# Patient Record
Sex: Male | Born: 1950
Health system: Southern US, Community
[De-identification: ages and names within clinical notes are randomized; demographics above are authoritative.]

## PROBLEM LIST (undated history)

## (undated) DIAGNOSIS — I10 Essential (primary) hypertension: Secondary | ICD-10-CM

## (undated) DIAGNOSIS — N189 Chronic kidney disease, unspecified: Secondary | ICD-10-CM

## (undated) HISTORY — PX: COLONOSCOPY: SHX174

## (undated) HISTORY — PX: ANKLE SURGERY: SHX546

## (undated) HISTORY — PX: EYE SURGERY: SHX253

---

## 2001-09-30 ENCOUNTER — Encounter: Payer: Self-pay | Admitting: Family Medicine

## 2001-09-30 ENCOUNTER — Ambulatory Visit (HOSPITAL_COMMUNITY): Admission: RE | Admit: 2001-09-30 | Discharge: 2001-09-30 | Payer: Self-pay | Admitting: Family Medicine

## 2002-02-13 ENCOUNTER — Ambulatory Visit (HOSPITAL_COMMUNITY): Admission: RE | Admit: 2002-02-13 | Discharge: 2002-02-13 | Payer: Self-pay | Admitting: Family Medicine

## 2002-02-13 ENCOUNTER — Encounter: Payer: Self-pay | Admitting: Family Medicine

## 2002-03-12 ENCOUNTER — Ambulatory Visit (HOSPITAL_COMMUNITY): Admission: RE | Admit: 2002-03-12 | Discharge: 2002-03-12 | Payer: Self-pay | Admitting: General Surgery

## 2003-02-12 ENCOUNTER — Encounter: Payer: Self-pay | Admitting: Family Medicine

## 2003-02-12 ENCOUNTER — Ambulatory Visit (HOSPITAL_COMMUNITY): Admission: RE | Admit: 2003-02-12 | Discharge: 2003-02-12 | Payer: Self-pay | Admitting: Family Medicine

## 2007-03-19 ENCOUNTER — Ambulatory Visit (HOSPITAL_COMMUNITY): Admission: RE | Admit: 2007-03-19 | Discharge: 2007-03-19 | Payer: Self-pay | Admitting: Family Medicine

## 2009-12-13 ENCOUNTER — Emergency Department (HOSPITAL_COMMUNITY): Admission: EM | Admit: 2009-12-13 | Discharge: 2009-12-13 | Payer: Self-pay | Admitting: Emergency Medicine

## 2010-10-22 ENCOUNTER — Encounter: Payer: Self-pay | Admitting: Internal Medicine

## 2012-12-26 ENCOUNTER — Other Ambulatory Visit: Payer: Self-pay | Admitting: *Deleted

## 2012-12-26 MED ORDER — METOPROLOL TARTRATE 100 MG PO TABS: ORAL_TABLET | ORAL | Status: DC

## 2012-12-26 MED ORDER — PRAVASTATIN SODIUM 40 MG PO TABS: 40.0000 mg | ORAL_TABLET | Freq: Every day | ORAL | Status: DC

## 2012-12-26 MED ORDER — QUINAPRIL HCL 40 MG PO TABS: ORAL_TABLET | ORAL | Status: DC

## 2012-12-26 NOTE — Telephone Encounter (Signed)
Called patient and verified strength of medicine and directions.

## 2012-12-26 NOTE — Telephone Encounter (Signed)
Please print mail order 

## 2014-01-10 ENCOUNTER — Emergency Department (HOSPITAL_COMMUNITY)
Admission: EM | Admit: 2014-01-10 | Discharge: 2014-01-10 | Disposition: A | Discharge status: Home / Self Care | Payer: 59 | Attending: Emergency Medicine | Admitting: Emergency Medicine

## 2014-01-10 ENCOUNTER — Emergency Department (HOSPITAL_COMMUNITY): Payer: 59

## 2014-01-10 ENCOUNTER — Encounter (HOSPITAL_COMMUNITY): Payer: Self-pay | Admitting: Emergency Medicine

## 2014-01-10 DIAGNOSIS — Z792 Long term (current) use of antibiotics: Secondary | ICD-10-CM | POA: Insufficient documentation

## 2014-01-10 DIAGNOSIS — Y929 Unspecified place or not applicable: Secondary | ICD-10-CM | POA: Insufficient documentation

## 2014-01-10 DIAGNOSIS — Y9389 Activity, other specified: Secondary | ICD-10-CM | POA: Insufficient documentation

## 2014-01-10 DIAGNOSIS — S91311A Laceration without foreign body, right foot, initial encounter: Secondary | ICD-10-CM

## 2014-01-10 DIAGNOSIS — W28XXXA Contact with powered lawn mower, initial encounter: Secondary | ICD-10-CM | POA: Insufficient documentation

## 2014-01-10 DIAGNOSIS — Z23 Encounter for immunization: Secondary | ICD-10-CM | POA: Insufficient documentation

## 2014-01-10 DIAGNOSIS — S99921A Unspecified injury of right foot, initial encounter: Secondary | ICD-10-CM

## 2014-01-10 DIAGNOSIS — I10 Essential (primary) hypertension: Secondary | ICD-10-CM | POA: Insufficient documentation

## 2014-01-10 DIAGNOSIS — S91309A Unspecified open wound, unspecified foot, initial encounter: Secondary | ICD-10-CM | POA: Insufficient documentation

## 2014-01-10 DIAGNOSIS — Z79899 Other long term (current) drug therapy: Secondary | ICD-10-CM | POA: Insufficient documentation

## 2014-01-10 DIAGNOSIS — F17200 Nicotine dependence, unspecified, uncomplicated: Secondary | ICD-10-CM | POA: Insufficient documentation

## 2014-01-10 HISTORY — DX: Essential (primary) hypertension: I10

## 2014-01-10 MED ORDER — CEPHALEXIN 500 MG PO CAPS: 500.0000 mg | ORAL_CAPSULE | Freq: Four times a day (QID) | ORAL | Status: DC

## 2014-01-10 MED ORDER — TETANUS-DIPHTH-ACELL PERTUSSIS 5-2.5-18.5 LF-MCG/0.5 IM SUSP
0.5000 mL | Freq: Once | INTRAMUSCULAR | Status: AC
  Administered 2014-01-10: 0.5 mL via INTRAMUSCULAR
  Filled 2014-01-10: qty 0.5

## 2014-01-10 MED ORDER — OXYCODONE-ACETAMINOPHEN 5-325 MG PO TABS: 1.0000 | ORAL_TABLET | ORAL | Status: DC | PRN

## 2014-01-10 MED ORDER — OXYCODONE-ACETAMINOPHEN 5-325 MG PO TABS
1.0000 | ORAL_TABLET | Freq: Once | ORAL | Status: AC
  Administered 2014-01-10: 1 via ORAL
  Filled 2014-01-10: qty 1

## 2014-01-10 MED ORDER — CEPHALEXIN 500 MG PO CAPS
500.0000 mg | ORAL_CAPSULE | Freq: Once | ORAL | Status: AC
  Administered 2014-01-10: 500 mg via ORAL
  Filled 2014-01-10: qty 1

## 2014-01-10 NOTE — ED Notes (Signed)
Pt presents to ED with cut on posterior aspect of left heel. States he cut it on the lawn mower while mowing the lawn. Pt unable to remember last tetanus shot. No paraesthesia, pulses palpable. No pallor noted.

## 2014-01-10 NOTE — ED Provider Notes (Signed)
CSN: [id number]     Arrival date & time 01/10/14  1008 History  This chart was scribed for non-physician practitioner, Kerrie Buffalo, FNP,working with Benny Lennert, MD, by Karle Plumber, ED Scribe.  This patient was seen in room [facility]12/[facility]12 and the patient's care was started at 10:39 AM.  Chief Complaint  Patient presents with  . Extremity Laceration   The history is provided by the patient. No language interpreter was used.   HPI Comments:  John Proctor is a 63 y.o. male who presents to the Emergency Department complaining of a laceration to the posterior aspect of his left heel that occurred PTA. Pt states he was mowing the lawn and tripped, causing his heel to go under the mower. Pt states his pain is mild at 4/10. He denies any injury to his foot, leg or any other part of his body. He denies any other symptoms and has no other complaints. Bleeding is now controlled.    Past Medical History  Diagnosis Date  . Hypertension    No past surgical history on file. History reviewed. No pertinent family history. History  Substance Use Topics  . Smoking status: Current Every Day Smoker -- 0.50 packs/day for 25 years    Types: Cigarettes  . Smokeless tobacco: Not on file  . Alcohol Use: 1.8 oz/week    3 Cans of beer per week    Review of Systems  A complete 10 system review of systems was obtained and all systems are negative except as noted in the HPI and PMH.   Allergies  Review of patient's allergies indicates no known allergies.  Home Medications   Current Outpatient Rx  Name  Route  Sig  Dispense  Refill  . metoprolol (LOPRESSOR) 100 MG tablet      One by mouth every day   90 tablet   1   . quinapril (ACCUPRIL) 40 MG tablet      One by mouth every day   90 tablet   1   . cephALEXin (KEFLEX) 500 MG capsule   Oral   Take 1 capsule (500 mg total) by mouth 4 (four) times daily.   20 capsule   0   . oxyCODONE-acetaminophen (ROXICET) 5-325 MG per tablet    Oral   Take 1 tablet by mouth every 4 (four) hours as needed for severe pain.   20 tablet   0    Triage Vitals: BP 173/104  Pulse 71  Resp 14  SpO2 96% Physical Exam  Nursing note and vitals reviewed. Constitutional: He is oriented to person, place, and time. He appears well-developed and well-nourished.  Eyes: EOM are normal.  Neck: Neck supple.  Cardiovascular:  Strong pedal pulses.   Pulmonary/Chest: Effort normal.  Abdominal: Soft. There is no tenderness.  Musculoskeletal: Normal range of motion.  Neurological: He is alert and oriented to person, place, and time. No cranial nerve deficit.  Skin: Skin is warm and dry.  6 cm avulsion laceration to posterior heel. Circular laceration above avulsion that is approximately 6 cm.   Psychiatric: He has a normal mood and affect. His behavior is normal.    ED Course  Procedures (including critical care time) Wound care, soaked in NSS with Betadine, wet to dry dressing, Antibiotics, pain management, Tetanus update.  Consult with ortho.   DIAGNOSTIC STUDIES: Oxygen Saturation is 96% on RA, normal by my interpretation.   COORDINATION OF CARE: 10:42 AM- Will X-Ray left foot to check for bony fragment.  Pt verbalizes understanding and agrees to plan. Labs Review Labs Reviewed - No data to display Imaging Review Dg Foot Complete Left  01/10/2014   CLINICAL DATA:  Laceration left heel from lawn mower injury.  EXAM: LEFT FOOT - COMPLETE 3+ VIEW  COMPARISON:  None.  FINDINGS: There is a focal defect/ laceration in the soft tissues of the heel posteriorly. No radiopaque foreign body. Joints of the left foot are aligned. No acute or healing fracture is identified.  IMPRESSION: Laceration of the posterior soft tissues of the heel. No acute osseous abnormality.   Electronically Signed   By: Britta MccreedySusan  Turner M.D.   On: 01/10/2014 11:09     MDM  I spoke with Dr. Romeo AppleHarrison, ortho on call, and he request that the area be cleaned well and wet to dry  dressing and follow up in the office.    I have reviewed this patient's vital signs, nurses notes, appropriate labs and imaging. I have discussed findings with the patient and plan of care. He states that he is a friend of Dr. Hilda LiasKeeling and he will follow up with him tomorrow. He will return here sooner for any problems.    Medication List    TAKE these medications       cephALEXin 500 MG capsule  Commonly known as:  KEFLEX  Take 1 capsule (500 mg total) by mouth 4 (four) times daily.     oxyCODONE-acetaminophen 5-325 MG per tablet  Commonly known as:  ROXICET  Take 1 tablet by mouth every 4 (four) hours as needed for severe pain.      ASK your doctor about these medications       metoprolol 100 MG tablet  Commonly known as:  LOPRESSOR  One by mouth every day     quinapril 40 MG tablet  Commonly known as:  ACCUPRIL  One by mouth every day         I personally performed the services described in this documentation, which was scribed in my presence. The recorded information has been reviewed and is accurate.    [facility] Orlene OchM Emberlin Verner, NP 01/10/14 1311

## 2014-01-10 NOTE — ED Notes (Signed)
Pt does not want crutches, states he has crutches at home that he can use

## 2014-01-10 NOTE — Discharge Instructions (Signed)
You have a large wound to the heel of your right foot that can not be sutured. You will need to follow up with Dr.Keeling tomorrow to assess the injury and decide if you will need a skin graft. Be sure to take the antibiotics to prevent infection. Do not take the narcotic if you are driving as it will make you sleepy. You can take Advil in addition to the other medications. Elevate the foot. Return as needed for problems.

## 2014-01-10 NOTE — ED Provider Notes (Signed)
Medical screening examination/treatment/procedure(s) were conducted as a shared visit with non-physician practitioner(s) and myself.  I personally evaluated the patient during the encounter.   EKG Interpretation None      Pt cut right heel on lawnmower.  pe avulsion superficial right heel  John LennertJoseph L Kayon Dozier, MD 01/10/14 1507

## 2014-01-10 NOTE — ED Notes (Signed)
Pt stated that he did need crutches, tall crutches provided for pt

## 2016-05-16 ENCOUNTER — Encounter (HOSPITAL_COMMUNITY): Payer: Self-pay | Admitting: Emergency Medicine

## 2016-05-16 ENCOUNTER — Emergency Department (HOSPITAL_COMMUNITY)
Admission: EM | Admit: 2016-05-16 | Discharge: 2016-05-17 | Disposition: A | Discharge status: Home / Self Care | Payer: 59 | Attending: Emergency Medicine | Admitting: Emergency Medicine

## 2016-05-16 DIAGNOSIS — I10 Essential (primary) hypertension: Secondary | ICD-10-CM | POA: Insufficient documentation

## 2016-05-16 DIAGNOSIS — N289 Disorder of kidney and ureter, unspecified: Secondary | ICD-10-CM | POA: Diagnosis not present

## 2016-05-16 DIAGNOSIS — F17210 Nicotine dependence, cigarettes, uncomplicated: Secondary | ICD-10-CM | POA: Insufficient documentation

## 2016-05-16 DIAGNOSIS — R1031 Right lower quadrant pain: Secondary | ICD-10-CM | POA: Diagnosis present

## 2016-05-16 DIAGNOSIS — Z79899 Other long term (current) drug therapy: Secondary | ICD-10-CM | POA: Diagnosis not present

## 2016-05-16 DIAGNOSIS — N23 Unspecified renal colic: Secondary | ICD-10-CM

## 2016-05-16 LAB — COMPREHENSIVE METABOLIC PANEL
ALBUMIN: 4.1 g/dL (ref 3.5–5.0)
ALT: 18 U/L (ref 17–63)
ANION GAP: 10 (ref 5–15)
AST: 25 U/L (ref 15–41)
Alkaline Phosphatase: 67 U/L (ref 38–126)
BILIRUBIN TOTAL: 0.5 mg/dL (ref 0.3–1.2)
BUN: 19 mg/dL (ref 6–20)
CHLORIDE: 105 mmol/L (ref 101–111)
CO2: 22 mmol/L (ref 22–32)
Calcium: 9.1 mg/dL (ref 8.9–10.3)
Creatinine, Ser: 1.34 mg/dL — ABNORMAL HIGH (ref 0.61–1.24)
GFR calc Af Amer: >60 mL/min (ref >60)
GFR, EST NON AFRICAN AMERICAN: 54 mL/min — AB (ref >60)
Glucose, Bld: 151 mg/dL — ABNORMAL HIGH (ref 65–99)
Potassium: 3.6 mmol/L (ref 3.5–5.1)
Sodium: 137 mmol/L (ref 135–145)
TOTAL PROTEIN: 7.3 g/dL (ref 6.5–8.1)

## 2016-05-16 LAB — LIPASE, BLOOD: Lipase: 50 U/L (ref 11–51)

## 2016-05-16 LAB — CBC
HCT: 43.5 % (ref 39.0–52.0)
HEMOGLOBIN: 14.8 g/dL (ref 13.0–17.0)
MCH: 29 pg (ref 26.0–34.0)
MCHC: 34 g/dL (ref 30.0–36.0)
MCV: 85.1 fL (ref 78.0–100.0)
Platelets: 237 10*3/uL (ref 150–400)
RBC: 5.11 MIL/uL (ref 4.22–5.81)
RDW: 12.8 % (ref 11.5–15.5)
WBC: 7.3 10*3/uL (ref 4.0–10.5)

## 2016-05-16 MED ORDER — SODIUM CHLORIDE 0.9 % IV SOLN: INTRAVENOUS | Status: DC

## 2016-05-16 MED ORDER — SODIUM CHLORIDE 0.9 % IV BOLUS (SEPSIS)
1000.0000 mL | Freq: Once | INTRAVENOUS | Status: AC
  Administered 2016-05-16: 1000 mL via INTRAVENOUS

## 2016-05-16 MED ORDER — KETOROLAC TROMETHAMINE 30 MG/ML IJ SOLN
30.0000 mg | Freq: Once | INTRAMUSCULAR | Status: AC
  Administered 2016-05-16: 30 mg via INTRAVENOUS
  Filled 2016-05-16: qty 1

## 2016-05-16 MED ORDER — ONDANSETRON HCL 4 MG/2ML IJ SOLN
4.0000 mg | Freq: Once | INTRAMUSCULAR | Status: AC
  Administered 2016-05-16: 4 mg via INTRAVENOUS
  Filled 2016-05-16: qty 2

## 2016-05-16 NOTE — ED Triage Notes (Signed)
Pt with RLQ abdominal pain that started about an hour ago.

## 2016-05-17 ENCOUNTER — Emergency Department (HOSPITAL_COMMUNITY): Payer: 59

## 2016-05-17 LAB — URINE MICROSCOPIC-ADD ON: SQUAMOUS EPITHELIAL / LPF: NONE SEEN

## 2016-05-17 LAB — URINALYSIS, ROUTINE W REFLEX MICROSCOPIC
Bilirubin Urine: NEGATIVE
Glucose, UA: NEGATIVE mg/dL
KETONES UR: NEGATIVE mg/dL
LEUKOCYTES UA: NEGATIVE
NITRITE: NEGATIVE
Specific Gravity, Urine: 1.02 (ref 1.005–1.030)
pH: 7 (ref 5.0–8.0)

## 2016-05-17 MED ORDER — TRAMADOL HCL 50 MG PO TABS
100.0000 mg | ORAL_TABLET | Freq: Four times a day (QID) | ORAL | 0 refills | Status: DC | PRN
Start: 2016-05-17 — End: 2019-07-14

## 2016-05-17 MED ORDER — METOCLOPRAMIDE HCL 5 MG/ML IJ SOLN
INTRAMUSCULAR | Status: AC
  Administered 2016-05-17: 10 mg
  Filled 2016-05-17: qty 2

## 2016-05-17 MED ORDER — FENTANYL CITRATE (PF) 100 MCG/2ML IJ SOLN
25.0000 ug | Freq: Once | INTRAMUSCULAR | Status: AC
  Administered 2016-05-17: 25 ug via INTRAVENOUS
  Filled 2016-05-17: qty 2

## 2016-05-17 MED ORDER — DIPHENHYDRAMINE HCL 50 MG/ML IJ SOLN
25.0000 mg | Freq: Once | INTRAMUSCULAR | Status: AC
  Administered 2016-05-17: 25 mg via INTRAVENOUS
  Filled 2016-05-17: qty 1

## 2016-05-17 MED ORDER — ONDANSETRON HCL 4 MG PO TABS: 4.0000 mg | ORAL_TABLET | Freq: Three times a day (TID) | ORAL | 0 refills | Status: DC | PRN

## 2016-05-17 NOTE — ED Notes (Signed)
Pt reports that he is unable to void at this time 

## 2016-05-17 NOTE — Discharge Instructions (Signed)
Drink plenty of fluids. Take the tramadol with acetaminophen 1000 mg 4 times a day for pain. Uses Zofran for nausea or vomiting. Recheck if you get fever, have uncontrolled vomiting or worsening pain. Her CT scan does not show any further kidney stones. However it was suggestive that you had just passed a kidney stone. Her pain should improve now. You need to get back with your primary care physician about your blood pressure and your kidney function.

## 2016-05-17 NOTE — ED Notes (Signed)
Pt reports increasing pain and nausea, and requesting meds. Dr Lynelle DoctorKnapp informed.

## 2016-05-17 NOTE — ED Notes (Signed)
To CT via wheelchair

## 2016-05-17 NOTE — ED Provider Notes (Signed)
[facility]-EMERGENCY DEPT Provider Note   CSN: [id number] Arrival date & time: 05/16/16  2256 By signing my name below, I, Bridgette Habermann, attest that this documentation has been prepared under the direction and in the presence of Devoria Albe, MD. Electronically Signed: Bridgette Habermann, ED Scribe. 05/17/16. 12:25 AM.  Time Seen 12:13 AM  History   Chief Complaint Chief Complaint  Patient presents with  . Abdominal Pain   HPI Comments: John Proctor is a 65 y.o. male with h/o HTN who presents to the Emergency Department complaining of sudden onset, constant, gradually worsening, non-radiating, aching, dull, 5/10 RLQ abdominal pain around 10 pm tonight. Pt states the pain woke him from his sleep. Pt also has associated mild nausea. Pt states pain is exacerbated with ambulating. He notes pain is improved when he bends his right knee. Denies h/o similar symptoms. Denies h/o abdominal surgery. Pt has been off his regular medications for a year and a half. Pt is an occasional drinker, he does not smoke. Pt denies fever, dysuria, urinary frequency, hematuria, vomiting, or any other associated symptoms.    PCP: Dr. Hughie Closs  The history is provided by the patient. No language interpreter was used.    Past Medical History:  Diagnosis Date  . Hypertension    There are no active problems to display for this patient.  History reviewed. No pertinent surgical history.   Home Medications    No meds in 1 1/2 years  Prior to Admission medications   Medication Sig Start Date End Date Taking? Authorizing Provider  cephALEXin (KEFLEX) 500 MG capsule Take 1 capsule (500 mg total) by mouth 4 (four) times daily. 01/10/14   Hope Orlene Och, NP  metoprolol (LOPRESSOR) 100 MG tablet One by mouth every day 12/26/12   Horald Pollen, PA-C  ondansetron (ZOFRAN) 4 MG tablet Take 1 tablet (4 mg total) by mouth every 8 (eight) hours as needed. 05/17/16   Devoria Albe, MD  oxyCODONE-acetaminophen (ROXICET) 5-325 MG per tablet Take 1  tablet by mouth every 4 (four) hours as needed for severe pain. 01/10/14   Hope Orlene Och, NP  quinapril (ACCUPRIL) 40 MG tablet One by mouth every day 12/26/12   Horald Pollen, PA-C  traMADol (ULTRAM) 50 MG tablet Take 2 tablets (100 mg total) by mouth every 6 (six) hours as needed. 05/17/16   Devoria Albe, MD    Family History History reviewed. No pertinent family history.  Social History Social History  Substance Use Topics  . Smoking status: Current Every Day Smoker    Packs/day: 0.50    Years: 25.00    Types: Cigarettes  . Smokeless tobacco: Never Used  . Alcohol use 1.8 oz/week    3 Cans of beer per week  employed   Allergies   Review of patient's allergies indicates no known allergies.   Review of Systems Review of Systems  Constitutional: Negative for fever.  Gastrointestinal: Positive for abdominal pain and nausea. Negative for vomiting.  Genitourinary: Negative for dysuria, frequency and hematuria.  All other systems reviewed and are negative.    Physical Exam Updated Vital Signs BP (!) 179/103 (BP Location: Left Arm)   Pulse 64   Temp 98.9 F (37.2 C) (Tympanic)   Resp 20   Ht 6\' 4"  (1.93 m)   Wt 205 lb (93 kg)   SpO2 100%   BMI 24.95 kg/m   Vital signs normal Except for hypertension   Physical Exam  Constitutional: He is oriented to  person, place, and time. He appears well-developed and well-nourished.  Non-toxic appearance. He does not appear ill. No distress.  HENT:  Head: Normocephalic and atraumatic.  Right Ear: External ear normal.  Left Ear: External ear normal.  Nose: Nose normal. No mucosal edema or rhinorrhea.  Mouth/Throat: Oropharynx is clear and moist and mucous membranes are normal. No dental abscesses or uvula swelling.  Eyes: Conjunctivae and EOM are normal. Pupils are equal, round, and reactive to light.  Neck: Normal range of motion and full passive range of motion without pain. Neck supple.  Cardiovascular: Normal rate, regular rhythm  and normal heart sounds.  Exam reveals no gallop and no friction rub.   No murmur heard. Pulmonary/Chest: Effort normal and breath sounds normal. No respiratory distress. He has no wheezes. He has no rhonchi. He has no rales. He exhibits no tenderness and no crepitus.  Abdominal: Soft. Normal appearance and bowel sounds are normal. He exhibits no distension. There is tenderness. There is no rebound and no guarding.    Tender in RLQ. No pain with knee-capping. No psoas is sign.  Musculoskeletal: Normal range of motion. He exhibits no edema or tenderness.  Moves all extremities well.   Neurological: He is alert and oriented to person, place, and time. He has normal strength. No cranial nerve deficit.  Skin: Skin is warm, dry and intact. No rash noted. No erythema. No pallor.  Psychiatric: He has a normal mood and affect. His speech is normal and behavior is normal. His mood appears not anxious.  Nursing note and vitals reviewed.    ED Treatments / Results  DIAGNOSTIC STUDIES: Oxygen Saturation is 100% on RA, normal by my interpretation.      Labs (all labs ordered are listed, but only abnormal results are displayed) Results for orders placed or performed during the hospital encounter of 05/16/16  Urinalysis, Routine w reflex microscopic (not at [facility])  Result Value Ref Range   Color, Urine YELLOW YELLOW   APPearance CLEAR CLEAR   Specific Gravity, Urine 1.020 1.005 - 1.030   pH 7.0 5.0 - 8.0   Glucose, UA NEGATIVE NEGATIVE mg/dL   Hgb urine dipstick LARGE (A) NEGATIVE   Bilirubin Urine NEGATIVE NEGATIVE   Ketones, ur NEGATIVE NEGATIVE mg/dL   Protein, ur TRACE (A) NEGATIVE mg/dL   Nitrite NEGATIVE NEGATIVE   Leukocytes, UA NEGATIVE NEGATIVE  Lipase, blood  Result Value Ref Range   Lipase 50 11 - 51 U/L  Comprehensive metabolic panel  Result Value Ref Range   Sodium 137 135 - 145 mmol/L   Potassium 3.6 3.5 - 5.1 mmol/L   Chloride 105 101 - 111 mmol/L   CO2 22 22 - 32 mmol/L     Glucose, Bld 151 (H) 65 - 99 mg/dL   BUN 19 6 - 20 mg/dL   Creatinine, Ser 1.61 (H) 0.61 - 1.24 mg/dL   Calcium 9.1 8.9 - 09.6 mg/dL   Total Protein 7.3 6.5 - 8.1 g/dL   Albumin 4.1 3.5 - 5.0 g/dL   AST 25 15 - 41 U/L   ALT 18 17 - 63 U/L   Alkaline Phosphatase 67 38 - 126 U/L   Total Bilirubin 0.5 0.3 - 1.2 mg/dL   GFR calc non Af Amer 54 (L) >60 mL/min   GFR calc Af Amer >60 >60 mL/min   Anion gap 10 5 - 15  CBC  Result Value Ref Range   WBC 7.3 4.0 - 10.5 K/uL   RBC 5.11 4.22 -  5.81 MIL/uL   Hemoglobin 14.8 13.0 - 17.0 g/dL   HCT 95.243.5 84.139.0 - 32.452.0 %   MCV 85.1 78.0 - 100.0 fL   MCH 29.0 26.0 - 34.0 pg   MCHC 34.0 30.0 - 36.0 g/dL   RDW 40.112.8 02.711.5 - 25.315.5 %   Platelets 237 150 - 400 K/uL  Urine microscopic-add on  Result Value Ref Range   Squamous Epithelial / LPF NONE SEEN NONE SEEN   WBC, UA 0-5 0 - 5 WBC/hpf   RBC / HPF TOO NUMEROUS TO COUNT 0 - 5 RBC/hpf   Bacteria, UA RARE (A) NONE SEEN   Laboratory interpretation all normal except For hematuria, renal insufficiency    EKG  EKG Interpretation None       Radiology Ct Renal Stone Study  Result Date: 05/17/2016 CLINICAL DATA:  Acute onset of right lower quadrant abdominal pain and nausea. Initial encounter. EXAM: CT ABDOMEN AND PELVIS WITHOUT CONTRAST TECHNIQUE: Multidetector CT imaging of the abdomen and pelvis was performed following the standard protocol without IV contrast. COMPARISON:  None. FINDINGS: The visualized lung bases are clear. Diffuse coronary artery calcifications are seen. The liver and spleen are unremarkable in appearance. The gallbladder is within normal limits. The pancreas and adrenal glands are unremarkable. Trace right-sided hydronephrosis is noted, with fluid and stranding seen tracking along the course of the right ureter. No distal obstructing stone is seen. This may reflect a recently passed stone. Would correlate with the patient's symptoms. Nonspecific perinephric stranding is noted  bilaterally. No nonobstructing renal stones are identified. No free fluid is identified. The small bowel is unremarkable in appearance. The stomach is within normal limits. No acute vascular abnormalities are seen. Scattered calcification is seen along the abdominal aorta and its branches. The appendix is normal in caliber, without evidence of appendicitis. Diverticulosis is noted along the entirety of the colon, most prominent at the ascending and sigmoid colon, without evidence of diverticulitis. The bladder is mildly distended and grossly unremarkable. The prostate is mildly enlarged, measuring up to 5.0 cm in transverse dimension. No inguinal lymphadenopathy is seen. No acute osseous abnormalities are identified. Mild degenerative change is noted at the lower lumbar spine. IMPRESSION: 1. Trace right-sided hydronephrosis, with fluid and stranding along the course of the right ureter. No distal obstructing stone seen. This may reflect a recently passed stone. Would correlate with the patient's symptoms. 2. Scattered calcification along the abdominal aorta and its branches. 3. Diffuse coronary artery calcifications seen. 4. Diverticulosis along the entirety of the colon, most prominent at the ascending and sigmoid colon, without evidence of diverticulitis. 5. Mildly enlarged prostate noted. Electronically Signed   By: Roanna RaiderJeffery  Chang M.D.   On: 05/17/2016 01:13    Procedures Procedures (including critical care time)  Medications Ordered in ED Medications  sodium chloride 0.9 % bolus 1,000 mL (0 mLs Intravenous Stopped 05/17/16 0047)  ketorolac (TORADOL) 30 MG/ML injection 30 mg (30 mg Intravenous Given 05/16/16 2338)  ondansetron (ZOFRAN) injection 4 mg (4 mg Intravenous Given 05/16/16 2338)  fentaNYL (SUBLIMAZE) injection 25 mcg (25 mcg Intravenous Given 05/17/16 0231)  metoCLOPramide (REGLAN) 5 MG/ML injection (10 mg  Given 05/17/16 0310)  diphenhydrAMINE (BENADRYL) injection 25 mg (25 mg Intravenous Given  05/17/16 0311)     Initial Impression / Assessment and Plan / ED Course  I have reviewed the triage vital signs and the nursing notes.  Pertinent labs & imaging results that were available during my care of the patient were reviewed by  me and considered in my medical decision making (see chart for details).  Clinical Course   12:24 AM Discussed treatment plan with pt at bedside which includes CT scan and pt agreed to plan.  Recheck at 1:30 AM patient was given a CT result. However he states his pain is getting worse. He was given fentanyl for pain.  Recheck at 3 AM patient states he still having pain and nausea. He was given Reglan and Benadryl.  Recheck at 4:10 AM patient states he's feeling better. We again discussed his test results. His blood pressure now is 148/91 without treatment. We discussed that he has already passed his kidney stone and he has no further kidney stones visible on his CT scan. He should be rechecked because of fever, has uncontrolled vomiting or worsening pain. He should also get back in touch of his primary care doctor about his blood pressure and his renal insufficiency.  Review of the [place] data site shows no entries in the past year.  Final Clinical Impressions(s) / ED Diagnoses   Final diagnoses:  RLQ abdominal pain  Ureteral colic  Renal insufficiency    New Prescriptions New Prescriptions   ONDANSETRON (ZOFRAN) 4 MG TABLET    Take 1 tablet (4 mg total) by mouth every 8 (eight) hours as needed.   TRAMADOL (ULTRAM) 50 MG TABLET    Take 2 tablets (100 mg total) by mouth every 6 (six) hours as needed.    Plan discharge  Devoria AlbeIva Colleen Donahoe, MD, FACEP   I personally performed the services described in this documentation, which was scribed in my presence. The recorded information has been reviewed and considered.  Devoria AlbeIva Demtrius Rounds, MD, Concha PyoFACEP     Raynald Rouillard, MD 05/17/16 [phone]

## 2016-05-18 LAB — URINE CULTURE

## 2018-02-04 ENCOUNTER — Encounter: Payer: Self-pay | Admitting: Nurse Practitioner

## 2018-02-19 ENCOUNTER — Encounter (INDEPENDENT_AMBULATORY_CARE_PROVIDER_SITE_OTHER): Payer: Self-pay | Admitting: *Deleted

## 2018-05-01 ENCOUNTER — Ambulatory Visit: Payer: 59 | Admitting: Nurse Practitioner

## 2018-12-03 ENCOUNTER — Emergency Department (HOSPITAL_COMMUNITY)
Admission: EM | Admit: 2018-12-03 | Discharge: 2018-12-03 | Disposition: A | Discharge status: Home / Self Care | Payer: 59 | Attending: Emergency Medicine | Admitting: Emergency Medicine

## 2018-12-03 ENCOUNTER — Other Ambulatory Visit: Payer: Self-pay

## 2018-12-03 ENCOUNTER — Emergency Department (HOSPITAL_COMMUNITY): Payer: 59

## 2018-12-03 ENCOUNTER — Encounter (HOSPITAL_COMMUNITY): Payer: Self-pay | Admitting: Emergency Medicine

## 2018-12-03 DIAGNOSIS — W109XXA Fall (on) (from) unspecified stairs and steps, initial encounter: Secondary | ICD-10-CM | POA: Insufficient documentation

## 2018-12-03 DIAGNOSIS — I10 Essential (primary) hypertension: Secondary | ICD-10-CM | POA: Diagnosis not present

## 2018-12-03 DIAGNOSIS — W108XXA Fall (on) (from) other stairs and steps, initial encounter: Secondary | ICD-10-CM

## 2018-12-03 DIAGNOSIS — S7002XA Contusion of left hip, initial encounter: Secondary | ICD-10-CM | POA: Insufficient documentation

## 2018-12-03 DIAGNOSIS — Z79899 Other long term (current) drug therapy: Secondary | ICD-10-CM | POA: Insufficient documentation

## 2018-12-03 DIAGNOSIS — M25552 Pain in left hip: Secondary | ICD-10-CM | POA: Insufficient documentation

## 2018-12-03 DIAGNOSIS — F17210 Nicotine dependence, cigarettes, uncomplicated: Secondary | ICD-10-CM | POA: Diagnosis not present

## 2018-12-03 DIAGNOSIS — Y939 Activity, unspecified: Secondary | ICD-10-CM | POA: Diagnosis not present

## 2018-12-03 DIAGNOSIS — Y929 Unspecified place or not applicable: Secondary | ICD-10-CM | POA: Diagnosis not present

## 2018-12-03 DIAGNOSIS — Y999 Unspecified external cause status: Secondary | ICD-10-CM | POA: Diagnosis not present

## 2018-12-03 DIAGNOSIS — S79912A Unspecified injury of left hip, initial encounter: Secondary | ICD-10-CM | POA: Diagnosis present

## 2018-12-03 NOTE — ED Triage Notes (Signed)
Per EMS pt states pt fell on concrete yesterday. Pt has swelling to the left hip. Pt was ambulatory from stretcher to the ER bed.

## 2018-12-03 NOTE — Discharge Instructions (Addendum)
Your x-rays did not show any sign of a broken bone.  You have a deep bruise which will take time to heal.  Use crutches or a cane if you are not able to bear weight comfortably.  Apply ice for 30 minutes at a time, 4 times a day.  Take ibuprofen or naproxen as needed for pain.  If you need additional pain relief, you may take acetaminophen in addition.

## 2018-12-03 NOTE — ED Provider Notes (Signed)
[facility] EMERGENCY DEPARTMENT Provider Note   CSN: [id number] Arrival date & time: 12/03/18  0419    History   Chief Complaint Chief Complaint  Patient presents with  . Hip Pain    HPI JEAN SLOMKA is a 68 y.o. male.   The history is provided by the patient.  He has history of hypertension, and comes in because of hip pain following a fall.  He states that he lost his balance going up some steps and landed injuring his left hip.  He has been able to ambulate on it, but it has gotten progressively more painful through the evening and night.  He currently rates pain at 6/10.  He denies other injury.  Past Medical History:  Diagnosis Date  . Hypertension     There are no active problems to display for this patient.   History reviewed. No pertinent surgical history.      Home Medications    Prior to Admission medications   Medication Sig Start Date End Date Taking? Authorizing Provider  cephALEXin (KEFLEX) 500 MG capsule Take 1 capsule (500 mg total) by mouth 4 (four) times daily. 01/10/14   Janne Napoleon, NP  metoprolol (LOPRESSOR) 100 MG tablet One by mouth every day 12/26/12   Horald Pollen, PA-C  ondansetron (ZOFRAN) 4 MG tablet Take 1 tablet (4 mg total) by mouth every 8 (eight) hours as needed. 05/17/16   Devoria Albe, MD  oxyCODONE-acetaminophen (ROXICET) 5-325 MG per tablet Take 1 tablet by mouth every 4 (four) hours as needed for severe pain. 01/10/14   Janne Napoleon, NP  quinapril (ACCUPRIL) 40 MG tablet One by mouth every day 12/26/12   Horald Pollen, PA-C  traMADol (ULTRAM) 50 MG tablet Take 2 tablets (100 mg total) by mouth every 6 (six) hours as needed. 05/17/16   Devoria Albe, MD    Family History No family history on file.  Social History Social History   Tobacco Use  . Smoking status: Current Every Day Smoker    Packs/day: 0.50    Years: 25.00    Pack years: 12.50    Types: Cigarettes  . Smokeless tobacco: Never Used  Substance Use Topics  .  Alcohol use: Yes    Alcohol/week: 3.0 standard drinks    Types: 3 Cans of beer per week  . Drug use: Not on file     Allergies   Patient has no known allergies.   Review of Systems Review of Systems  All other systems reviewed and are negative.    Physical Exam Updated Vital Signs BP (!) 144/96   Pulse 70   Temp 98.1 F (36.7 C) (Oral)   Resp 16   SpO2 96%   Physical Exam Vitals signs and nursing note reviewed.    68 year old male, resting comfortably and in no acute distress. Vital signs are significant for elevated blood pressure. Oxygen saturation is 96%, which is normal. Head is normocephalic and atraumatic. PERRLA, EOMI. Oropharynx is clear. Neck is nontender and supple without adenopathy or JVD. Back is nontender and there is no CVA tenderness. Lungs are clear without rales, wheezes, or rhonchi. Chest is nontender. Heart has regular rate and rhythm without murmur. Abdomen is soft, flat, nontender without masses or hepatosplenomegaly and peristalsis is normoactive. Extremities: No shortening or rotation of the left leg.  There is point tenderness over the lateral aspect of the left hip.  There is pain with range of motion of the left  hip and any direction.  There is no tenderness palpation over the pelvic brim.  Remainder of extremity exam is normal. Skin is warm and dry without rash. Neurologic: Mental status is normal, cranial nerves are intact, there are no motor or sensory deficits.  ED Treatments / Results   Radiology Ct Hip Left Wo Contrast  Result Date: 12/03/2018 CLINICAL DATA:  Fall on the concrete yesterday with swelling at the left hip. EXAM: CT OF THE LEFT HIP WITHOUT CONTRAST TECHNIQUE: Multidetector CT imaging of the left hip was performed according to the standard protocol. Multiplanar CT image reconstructions were also generated. COMPARISON:  None. FINDINGS: Bones/Joint/Cartilage Negative for fracture or dislocation. Hip osteoarthritis with marginal  spurring and superolateral joint narrowing. Ligaments Suboptimally assessed by CT. Muscles and Tendons Left gluteus maximus swelling with high-density component from hemorrhage. Soft tissues Subcutaneous stranding about the posterior left hip compatible with contusion in this setting. There is 3.7 cm rounded high-density area compatible with hematoma. IMPRESSION: 1. Left gluteal soft tissue contusion/hematoma. 2. Negative for fracture. 3. Moderate to advanced hip osteoarthritis. Electronically Signed   By: Marnee Spring M.D.   On: 12/03/2018 06:13   Dg Hip Unilat W Or Wo Pelvis 2-3 Views Left  Result Date: 12/03/2018 CLINICAL DATA:  Fall with left hip swelling. EXAM: DG HIP (WITH OR WITHOUT PELVIS) 2-3V LEFT COMPARISON:  None. FINDINGS: Equivocal fat stranding about the left lateral hip, possible contusion. No underlying fracture or dislocation. Degenerative hip spurring with joint narrowing asymmetric to the left. Atherosclerotic calcification. Posttraumatic deformity of the anterior right ilium. IMPRESSION: 1. No acute osseous finding. 2. Hip osteoarthritis greater on the left. Electronically Signed   By: Marnee Spring M.D.   On: 12/03/2018 05:19    Procedures Procedures  Medications Ordered in ED Medications - No data to display   Initial Impression / Assessment and Plan / ED Course  I have reviewed the triage vital signs and the nursing notes.  Pertinent imaging results that were available during my care of the patient were reviewed by me and considered in my medical decision making (see chart for details).  Fall with injury to left hip.  I am concerned about possible fracture.  He is sent for x-rays which showed no evidence of fracture.  He is sent for CT scan to look for occult fracture, and this is also negative.  CT finding strongly suggestive of deep bruise.  Patient is advised of these findings.  He is advised to use crutches or cane as needed (he states he has both at home).  He is  advised on applying ice and using over-the-counter analgesics as needed for pain.  Follow-up with orthopedics if not improving.  Final Clinical Impressions(s) / ED Diagnoses   Final diagnoses:  Fall down steps, initial encounter  Contusion of left hip, initial encounter    ED Discharge Orders    None       Dione Booze, MD 12/03/18 [phone]

## 2019-07-14 ENCOUNTER — Encounter: Payer: Self-pay | Admitting: Orthopaedic Surgery

## 2019-07-14 ENCOUNTER — Telehealth: Payer: Self-pay | Admitting: Orthopaedic Surgery

## 2019-07-14 ENCOUNTER — Other Ambulatory Visit: Payer: Self-pay

## 2019-07-14 ENCOUNTER — Ambulatory Visit: Payer: 59

## 2019-07-14 ENCOUNTER — Ambulatory Visit: Payer: 59 | Admitting: Orthopaedic Surgery

## 2019-07-14 VITALS — BP 178/91 | HR 76 | Ht 76.0 in | Wt 193.0 lb

## 2019-07-14 DIAGNOSIS — M5441 Lumbago with sciatica, right side: Secondary | ICD-10-CM | POA: Diagnosis not present

## 2019-07-14 DIAGNOSIS — M25551 Pain in right hip: Secondary | ICD-10-CM

## 2019-07-14 DIAGNOSIS — G8929 Other chronic pain: Secondary | ICD-10-CM

## 2019-07-14 MED ORDER — HYDROCODONE-ACETAMINOPHEN 5-325 MG PO TABS: ORAL_TABLET | ORAL | 0 refills | Status: DC

## 2019-07-14 MED ORDER — PREDNISONE 5 MG (21) PO TBPK: ORAL_TABLET | ORAL | 0 refills | Status: DC

## 2019-07-14 NOTE — Telephone Encounter (Signed)
Patient relays that his medications prescribed today should be sent to the pharmacy which was to be updated today:  [facility], [place].

## 2019-07-14 NOTE — Progress Notes (Signed)
Subjective:    Patient ID: John Proctor, male    DOB: 29-May-1951, 68 y.o.   MRN: [id number]  HPI He fell in March and hurt his pelvis and hip.  He had x-rays then plus CT of the left hip.  He had no fracture but has DJD of the left hip.  He has begun to have right hip pain and right lower back and pelvis pain over the last several months that is getting worse.  He has pain running down his right leg past the knee to the lateral right foot.  He is not getting any better.  He works at [employer] and says at the end of his shift he can hardly walk.  He is getting worse.  He has no new trauma.  Nothing seems to help.  He has tried Tylenol, Advil, heat, ice and massage.   Review of Systems  Constitutional: Positive for activity change.  Musculoskeletal: Positive for arthralgias, back pain and gait problem.  All other systems reviewed and are negative.  For Review of Systems, all other systems reviewed and are negative.  The following is a summary of the past history medically, past history surgically, known current medicines, social history and family history.  This information is gathered electronically by the computer from prior information and documentation.  I review this each visit and have found including this information at this point in the chart is beneficial and informative.   Past Medical History:  Diagnosis Date  . Hypertension     History reviewed. No pertinent surgical history.  Current Outpatient Medications on File Prior to Visit  Medication Sig Dispense Refill  . metoprolol (LOPRESSOR) 100 MG tablet One by mouth every day 90 tablet 1  . naproxen sodium (ALEVE) 220 MG tablet Take 220 mg by mouth.    . quinapril (ACCUPRIL) 40 MG tablet One by mouth every day 90 tablet 1  . rosuvastatin (CRESTOR) 20 MG tablet Take 20 mg by mouth daily.     No current facility-administered medications on file prior to visit.     Social History   Socioeconomic History  .  Marital status: Single    Spouse name: Not on file  . Number of children: Not on file  . Years of education: Not on file  . Highest education level: Not on file  Occupational History  . Not on file  Social Needs  . Financial resource strain: Not on file  . Food insecurity    Worry: Not on file    Inability: Not on file  . Transportation needs    Medical: Not on file    Non-medical: Not on file  Tobacco Use  . Smoking status: Current Every Day Smoker    Packs/day: 0.50    Years: 25.00    Pack years: 12.50    Types: Cigarettes  . Smokeless tobacco: Never Used  Substance and Sexual Activity  . Alcohol use: Yes    Alcohol/week: 3.0 standard drinks    Types: 3 Cans of beer per week  . Drug use: Not on file  . Sexual activity: Not on file  Lifestyle  . Physical activity    Days per week: Not on file    Minutes per session: Not on file  . Stress: Not on file  Relationships  . Social Herbalist on phone: Not on file    Gets together: Not on file    Attends religious service: Not on file  Active member of club or organization: Not on file    Attends meetings of clubs or organizations: Not on file    Relationship status: Not on file  . Intimate partner violence    Fear of current or ex partner: Not on file    Emotionally abused: Not on file    Physically abused: Not on file    Forced sexual activity: Not on file  Other Topics Concern  . Not on file  Social History Narrative  . Not on file    Family History  Problem Relation Age of Onset  . Multiple sclerosis Mother   . High blood pressure Father     BP (!) 178/91   Pulse 76   Ht 6\' 4"  (1.93 m)   Wt 193 lb (87.5 kg)   BMI 23.49 kg/m   Body mass index is 23.49 kg/m.      Objective:   Physical Exam Vitals signs reviewed.  Constitutional:      Appearance: He is well-developed.  HENT:     Head: Normocephalic and atraumatic.  Eyes:     Conjunctiva/sclera: Conjunctivae normal.     Pupils:  Pupils are equal, round, and reactive to light.  Neck:     Musculoskeletal: Normal range of motion and neck supple.  Cardiovascular:     Rate and Rhythm: Normal rate and regular rhythm.  Pulmonary:     Effort: Pulmonary effort is normal.  Abdominal:     Palpations: Abdomen is soft.  Musculoskeletal:       Back:  Skin:    General: Skin is warm and dry.  Neurological:     Mental Status: He is alert and oriented to person, place, and time.     Cranial Nerves: No cranial nerve deficit.     Motor: No abnormal muscle tone.     Coordination: Coordination normal.     Deep Tendon Reflexes: Reflexes are normal and symmetric. Reflexes normal.  Psychiatric:        Behavior: Behavior normal.        Thought Content: Thought content normal.        Judgment: Judgment normal.   x-rays were done of the lumbar spine and right hip, reported separately.  I have reviewed the ER records, x-rays, CT from March of this year.        Assessment & Plan:   Encounter Diagnoses  Name Primary?  . Chronic right-sided low back pain with right-sided sciatica Yes  . Pain in right hip    I will begin prednisone dose pack.  I will begin pain medicine.  Return in one week.  He may need MRI of the back/hip.  Call if any problem.  Precautions discussed.   Electronically Signed April, MD 10/13/202012:40 PM

## 2019-07-23 ENCOUNTER — Encounter: Payer: Self-pay | Admitting: Orthopaedic Surgery

## 2019-07-23 ENCOUNTER — Other Ambulatory Visit: Payer: Self-pay

## 2019-07-23 ENCOUNTER — Ambulatory Visit (INDEPENDENT_AMBULATORY_CARE_PROVIDER_SITE_OTHER): Payer: 59 | Admitting: Orthopaedic Surgery

## 2019-07-23 VITALS — BP 168/94 | HR 63 | Ht 76.0 in | Wt 193.0 lb

## 2019-07-23 DIAGNOSIS — M5441 Lumbago with sciatica, right side: Secondary | ICD-10-CM | POA: Diagnosis not present

## 2019-07-23 DIAGNOSIS — G8929 Other chronic pain: Secondary | ICD-10-CM | POA: Diagnosis not present

## 2019-07-23 MED ORDER — OXYCODONE-ACETAMINOPHEN 7.5-325 MG PO TABS: 1.0000 | ORAL_TABLET | Freq: Four times a day (QID) | ORAL | 0 refills | Status: AC | PRN

## 2019-07-23 NOTE — Progress Notes (Signed)
Patient John Proctor, male DOB:1951/03/31, 68 y.o. XBD:[id number]  Chief Complaint  Patient presents with  . Back Pain    HPI  John Proctor is a 68 y.o. male who has lower back pain, right sided sciatica and right hip pain.  He got better for a short while on the prednisone dose pack but pain has returned. He is hurting.  I will get MRI of the lumbar spine and increase pain medicine.  He is working but having a very hard time at it.   Body mass index is 23.49 kg/m.  ROS  Review of Systems  Constitutional: Positive for activity change.  Musculoskeletal: Positive for arthralgias, back pain and gait problem.  All other systems reviewed and are negative.   All other systems reviewed and are negative.  The following is a summary of the past history medically, past history surgically, known current medicines, social history and family history.  This information is gathered electronically by the computer from prior information and documentation.  I review this each visit and have found including this information at this point in the chart is beneficial and informative.    Past Medical History:  Diagnosis Date  . Hypertension     History reviewed. No pertinent surgical history.  Family History  Problem Relation Age of Onset  . Multiple sclerosis Mother   . High blood pressure Father     Social History Social History   Tobacco Use  . Smoking status: Current Every Day Smoker    Packs/day: 0.50    Years: 25.00    Pack years: 12.50    Types: Cigarettes  . Smokeless tobacco: Never Used  Substance Use Topics  . Alcohol use: Yes    Alcohol/week: 3.0 standard drinks    Types: 3 Cans of beer per week  . Drug use: Not on file    No Known Allergies  Current Outpatient Medications  Medication Sig Dispense Refill  . HYDROcodone-acetaminophen (NORCO/VICODIN) 5-325 MG tablet One tablet every four hours for pain. 30 tablet 0  . lisinopril (ZESTRIL) 40 MG tablet Take  40 mg by mouth daily.    . metoprolol (LOPRESSOR) 100 MG tablet One by mouth every day 90 tablet 1  . metoprolol succinate (TOPROL-XL) 25 MG 24 hr tablet Take 25 mg by mouth daily.    . naproxen sodium (ALEVE) 220 MG tablet Take 220 mg by mouth.    . rosuvastatin (CRESTOR) 20 MG tablet Take 20 mg by mouth daily.     No current facility-administered medications for this visit.      Physical Exam  Blood pressure (!) 168/94, pulse 63, height 6\' 4"  (1.93 m), weight 193 lb (87.5 kg).  Constitutional: overall normal hygiene, normal nutrition, well developed, normal grooming, normal body habitus. Assistive device:none  Musculoskeletal: gait and station Limp none, muscle tone and strength are normal, no tremors or atrophy is present.  .  Neurological: coordination overall normal.  Deep tendon reflex/nerve stretch intact.  Sensation normal.  Cranial nerves II-XII intact.   Skin:   Normal overall no scars, lesions, ulcers or rashes. No psoriasis.  Psychiatric: Alert and oriented x 3.  Recent memory intact, remote memory unclear.  Normal mood and affect. Well groomed.  Good eye contact.  Cardiovascular: overall no swelling, no varicosities, no edema bilaterally, normal temperatures of the legs and arms, no clubbing, cyanosis and good capillary refill.  Lymphatic: palpation is normal. Spine/Pelvis examination:  Inspection:  Overall, sacoiliac joint benign and hips nontender; without  crepitus or defects.   Thoracic spine inspection: Alignment normal without kyphosis present   Lumbar spine inspection:  Alignment  with normal lumbar lordosis, without scoliosis apparent.   Thoracic spine palpation:  without tenderness of spinal processes   Lumbar spine palpation: with tenderness of lumbar area; with tightness of lumbar muscles    Range of Motion:   Lumbar flexion, forward flexion is 10 with pain or tenderness    Lumbar extension is 5 with pain or tenderness   Left lateral bend is Normal   with pain or tenderness   Right lateral bend is Normal with pain or tenderness   Straight leg raising is Abnormal- 25   Strength & tone: Normal   Stability overall normal stability   All other systems reviewed and are negative   The patient has been educated about the nature of the problem(s) and counseled on treatment options.  The patient appeared to understand what I have discussed and is in agreement with it.  Encounter Diagnosis  Name Primary?  . Chronic right-sided low back pain with right-sided sciatica Yes    PLAN Call if any problems.  Precautions discussed.  Continue current medications.   Return to clinic 1 week   I have reviewed the [facility] Controlled Substance Reporting System web site prior to prescribing narcotic medicine for this patient.   Get the MRI  Electronically Signed Darreld Mclean, MD 10/22/20209:11 AM

## 2019-07-25 ENCOUNTER — Other Ambulatory Visit: Payer: Self-pay

## 2019-07-25 ENCOUNTER — Ambulatory Visit
Admission: RE | Admit: 2019-07-25 | Discharge: 2019-07-25 | Disposition: A | Discharge status: Home / Self Care | Payer: 59 | Source: Ambulatory Visit | Attending: Orthopaedic Surgery | Admitting: Orthopaedic Surgery

## 2019-07-25 DIAGNOSIS — G8929 Other chronic pain: Secondary | ICD-10-CM

## 2019-07-30 ENCOUNTER — Ambulatory Visit (INDEPENDENT_AMBULATORY_CARE_PROVIDER_SITE_OTHER): Payer: 59 | Admitting: Orthopaedic Surgery

## 2019-07-30 ENCOUNTER — Other Ambulatory Visit: Payer: Self-pay

## 2019-07-30 ENCOUNTER — Encounter: Payer: Self-pay | Admitting: Orthopaedic Surgery

## 2019-07-30 VITALS — BP 146/74 | HR 62 | Ht 76.0 in | Wt 194.0 lb

## 2019-07-30 DIAGNOSIS — G8929 Other chronic pain: Secondary | ICD-10-CM

## 2019-07-30 DIAGNOSIS — M5441 Lumbago with sciatica, right side: Secondary | ICD-10-CM

## 2019-07-30 NOTE — Progress Notes (Signed)
Patient John Proctor, male DOB:Feb 23, 1951, 68 y.o. XVQ:[id number]  Chief Complaint  Patient presents with  . Back Pain    HPI  John Proctor is a 68 y.o. male who has continued lower back pain.  He has right sided paresthesias.  He had the MRI which showed: IMPRESSION: 1. Shallow left paracentral disc protrusion at T12-L1 but no significant neural compression. 2. Mild bilateral lateral recess encroachment at L2-3. 3. Mild spinal and moderate bilateral lateral recess stenosis at L3-4. 4. Degenerative anterolisthesis of L5 due to severe facet disease. No significant spinal or lateral recess stenosis. There is mild bilateral foraminal stenosis, right greater than left. 5. Age advanced aortoiliac calcifications are noted.  No aneurysm.  I will arrange for epidural.  His mother had Multiple Sclerosis.  He is concerned he may get it as well.  I will have neurologist see him for further evaluation.  I will keep him out of work next two weeks.    Body mass index is 23.61 kg/m.  ROS  Review of Systems  All other systems reviewed and are negative.  The following is a summary of the past history medically, past history surgically, known current medicines, social history and family history.  This information is gathered electronically by the computer from prior information and documentation.  I review this each visit and have found including this information at this point in the chart is beneficial and informative.    Past Medical History:  Diagnosis Date  . Hypertension     History reviewed. No pertinent surgical history.  Family History  Problem Relation Age of Onset  . Multiple sclerosis Mother   . High blood pressure Father     Social History Social History   Tobacco Use  . Smoking status: Current Every Day Smoker    Packs/day: 0.50    Years: 25.00    Pack years: 12.50    Types: Cigarettes  . Smokeless tobacco: Never Used  Substance Use Topics  .  Alcohol use: Yes    Alcohol/week: 3.0 standard drinks    Types: 3 Cans of beer per week  . Drug use: Not on file    No Known Allergies  Current Outpatient Medications  Medication Sig Dispense Refill  . lisinopril (ZESTRIL) 40 MG tablet Take 40 mg by mouth daily.    . metoprolol (LOPRESSOR) 100 MG tablet One by mouth every day 90 tablet 1  . metoprolol succinate (TOPROL-XL) 25 MG 24 hr tablet Take 25 mg by mouth daily.    . naproxen sodium (ALEVE) 220 MG tablet Take 220 mg by mouth.    . oxyCODONE-acetaminophen (PERCOCET) 7.5-325 MG tablet Take 1 tablet by mouth every 6 (six) hours as needed for up to 14 days for moderate pain. One tablet every six hours as needed for pain.  14 day limit. 56 tablet 0  . rosuvastatin (CRESTOR) 20 MG tablet Take 20 mg by mouth daily.     No current facility-administered medications for this visit.      Physical Exam  Blood pressure (!) 146/74, pulse 62, height 6\' 4"  (1.93 m), weight 194 lb (88 kg).  Constitutional: overall normal hygiene, normal nutrition, well developed, normal grooming, normal body habitus. Assistive device:none  Musculoskeletal: gait and station Limp none, muscle tone and strength are normal, no tremors or atrophy is present.  .  Neurological: coordination overall normal.  Deep tendon reflex/nerve stretch intact.  Sensation normal.  Cranial nerves II-XII intact.   Skin:   Normal  overall no scars, lesions, ulcers or rashes. No psoriasis.  Psychiatric: Alert and oriented x 3.  Recent memory intact, remote memory unclear.  Normal mood and affect. Well groomed.  Good eye contact.  Cardiovascular: overall no swelling, no varicosities, no edema bilaterally, normal temperatures of the legs and arms, no clubbing, cyanosis and good capillary refill.  Lymphatic: palpation is normal.  Spine/Pelvis examination:  Inspection:  Overall, sacoiliac joint benign and hips nontender; without crepitus or defects.   Thoracic spine inspection:  Alignment normal without kyphosis present   Lumbar spine inspection:  Alignment  with normal lumbar lordosis, without scoliosis apparent.   Thoracic spine palpation:  without tenderness of spinal processes   Lumbar spine palpation: without tenderness of lumbar area; without tightness of lumbar muscles    Range of Motion:   Lumbar flexion, forward flexion is normal without pain or tenderness    Lumbar extension is full without pain or tenderness   Left lateral bend is normal without pain or tenderness   Right lateral bend is normal without pain or tenderness   Straight leg raising is normal  Strength & tone: normal   Stability overall normal stability  All other systems reviewed and are negative   The patient has been educated about the nature of the problem(s) and counseled on treatment options.  The patient appeared to understand what I have discussed and is in agreement with it.  Encounter Diagnosis  Name Primary?  . Chronic right-sided low back pain with right-sided sciatica Yes    PLAN Call if any problems.  Precautions discussed.  Continue current medications.   Return to clinic 1 month   See neurologist.  Get epidural.  Electronically Signed Sanjuana Kava, MD 10/29/20208:20 AM

## 2019-08-03 ENCOUNTER — Other Ambulatory Visit: Payer: Self-pay | Admitting: Orthopaedic Surgery

## 2019-08-03 DIAGNOSIS — G8929 Other chronic pain: Secondary | ICD-10-CM

## 2019-08-03 DIAGNOSIS — M5441 Lumbago with sciatica, right side: Secondary | ICD-10-CM

## 2019-08-05 ENCOUNTER — Other Ambulatory Visit: Payer: Self-pay

## 2019-08-05 ENCOUNTER — Ambulatory Visit
Admission: RE | Admit: 2019-08-05 | Discharge: 2019-08-05 | Disposition: A | Discharge status: Home / Self Care | Payer: 59 | Source: Ambulatory Visit | Attending: Orthopaedic Surgery | Admitting: Orthopaedic Surgery

## 2019-08-05 DIAGNOSIS — M5441 Lumbago with sciatica, right side: Secondary | ICD-10-CM

## 2019-08-05 DIAGNOSIS — G8929 Other chronic pain: Secondary | ICD-10-CM

## 2019-08-05 MED ORDER — IOPAMIDOL (ISOVUE-M 200) INJECTION 41%
1.0000 mL | Freq: Once | INTRAMUSCULAR | Status: AC
  Administered 2019-08-05: 09:00:00 1 mL via EPIDURAL

## 2019-08-05 MED ORDER — METHYLPREDNISOLONE ACETATE 40 MG/ML INJ SUSP (RADIOLOG
120.0000 mg | Freq: Once | INTRAMUSCULAR | Status: AC
  Administered 2019-08-05: 120 mg via EPIDURAL

## 2019-08-05 NOTE — Discharge Instructions (Signed)

## 2019-08-10 ENCOUNTER — Telehealth: Payer: Self-pay | Admitting: Orthopaedic Surgery

## 2019-08-10 NOTE — Telephone Encounter (Signed)
Called patient, left message at home # - cell not going through, regarding forms received in fax from [place] [phone]) - review with patient [employer] forms process when he returns call.

## 2019-08-11 ENCOUNTER — Ambulatory Visit: Payer: 59 | Admitting: Diagnostic Neuroimaging

## 2019-08-11 ENCOUNTER — Encounter: Payer: Self-pay | Admitting: Diagnostic Neuroimaging

## 2019-08-11 VITALS — BP 154/83 | HR 73 | Temp 98.6°F | Ht 76.0 in | Wt 194.2 lb

## 2019-08-11 DIAGNOSIS — R200 Anesthesia of skin: Secondary | ICD-10-CM | POA: Diagnosis not present

## 2019-08-11 DIAGNOSIS — R269 Unspecified abnormalities of gait and mobility: Secondary | ICD-10-CM | POA: Diagnosis not present

## 2019-08-11 DIAGNOSIS — H538 Other visual disturbances: Secondary | ICD-10-CM

## 2019-08-11 NOTE — Telephone Encounter (Signed)
Patient returned call; voiced understanding, and will come to office this afternoon to bring fee and to complete [employer] form packet.

## 2019-08-11 NOTE — Progress Notes (Signed)
[facility]  PATIENT: John MullRichard H Proctor DOB: 03/24/1951  REFERRING CLINICIAN: Hilda LiasKeeling, W HISTORY FROM: patient  REASON FOR VISIT: new consult    HISTORICAL  CHIEF COMPLAINT:  Chief Complaint  Patient presents with  . Chronic low back pain, right side    rm 6, New Pt, "fell 8 months ago onto the right side of my back"    HISTORY OF PRESENT ILLNESS:   68 year old male here for evaluation of low back pain and gait difficulty.  For past 5 years patient had intermittent lower back pain, rating to the right hip and right leg.  This is been managed conservatively over time.  Symptoms fluctuate but seem to be worse when he is active or works at long hours.  8 months ago he fell down at home and since that time is been having more problems.  He is having more gait and balance difficulties.  He has had issues with blurred vision in the right eye in the past.  Sometimes he has numbness in his arms at nighttime.  Patient's mother has diagnosis of MS and patient's wife is concerned about similar diagnosis.  Patient has never had MRI of the brain.  No bowel or bladder dysfunction.  No slurred speech.    REVIEW OF SYSTEMS: Full 14 system review of systems performed and negative with exception of: As per HPI.  ALLERGIES: No Known Allergies  HOME MEDICATIONS: Outpatient Medications Prior to Visit  Medication Sig Dispense Refill  . lisinopril (ZESTRIL) 40 MG tablet Take 40 mg by mouth daily.    . metoprolol succinate (TOPROL-XL) 25 MG 24 hr tablet Take 25 mg by mouth daily.    . naproxen sodium (ALEVE) 220 MG tablet Take 220 mg by mouth.    . rosuvastatin (CRESTOR) 20 MG tablet Take 20 mg by mouth daily.    . metoprolol (LOPRESSOR) 100 MG tablet One by mouth every day 90 tablet 1   No facility-administered medications prior to visit.     PAST MEDICAL HISTORY: Past Medical History:  Diagnosis Date  . Hypertension     PAST SURGICAL HISTORY: Past Surgical History:   Procedure Laterality Date  . ANKLE SURGERY     fusion    FAMILY HISTORY: Family History  Problem Relation Age of Onset  . Multiple sclerosis Mother   . High blood pressure Father   . Heart attack Brother   . High blood pressure Brother     SOCIAL HISTORY: Social History   Socioeconomic History  . Marital status: Married    Spouse name: Liborio NixonJanice  . Number of children: 1  . Years of education: Not on file  . Highest education level: Bachelor's degree (e.g., BA, AB, BS)  Occupational History  . Not on file  Social Needs  . Financial resource strain: Not on file  . Food insecurity    Worry: Not on file    Inability: Not on file  . Transportation needs    Medical: Not on file    Non-medical: Not on file  Tobacco Use  . Smoking status: Former Smoker    Packs/day: 0.50    Years: 25.00    Pack years: 12.50    Types: Cigarettes    Quit date: 08/10/2012    Years since quitting: 7.0  . Smokeless tobacco: Never Used  Substance and Sexual Activity  . Alcohol use: Yes    Alcohol/week: 3.0 standard drinks    Types: 3 Cans of beer per week  Comment: 08/11/19 per day  . Drug use: Never  . Sexual activity: Not on file  Lifestyle  . Physical activity    Days per week: Not on file    Minutes per session: Not on file  . Stress: Not on file  Relationships  . Social Herbalist on phone: Not on file    Gets together: Not on file    Attends religious service: Not on file    Active member of club or organization: Not on file    Attends meetings of clubs or organizations: Not on file    Relationship status: Not on file  . Intimate partner violence    Fear of current or ex partner: Not on file    Emotionally abused: Not on file    Physically abused: Not on file    Forced sexual activity: Not on file  Other Topics Concern  . Not on file  Social History Narrative   Lives with wife   Caffeine- coffee 1 cup daily     PHYSICAL EXAM  GENERAL EXAM/CONSTITUTIONAL:  Vitals:  Vitals:   08/11/19 1531  BP: (!) 154/83  Pulse: 73  Temp: 98.6 F (37 C)  Weight: 194 lb 3.2 oz (88.1 kg)  Height: 6\' 4"  (1.93 m)     Body mass index is 23.64 kg/m. Wt Readings from Last 3 Encounters:  08/11/19 194 lb 3.2 oz (88.1 kg)  07/30/19 194 lb (88 kg)  07/23/19 193 lb (87.5 kg)     Patient is in no distress; well developed, nourished and groomed; neck is supple  CARDIOVASCULAR:  Examination of carotid arteries is normal; no carotid bruits  Regular rate and rhythm, no murmurs  Examination of peripheral vascular system by observation and palpation is normal  EYES:  Ophthalmoscopic exam of optic discs and posterior segments is normal; no papilledema or hemorrhages  No exam data present  MUSCULOSKELETAL:  Gait, strength, tone, movements noted in Neurologic exam below  NEUROLOGIC: MENTAL STATUS:  No flowsheet data found.  awake, alert, oriented to person, place and time  recent and remote memory intact  normal attention and concentration  language fluent, comprehension intact, naming intact  fund of knowledge appropriate  CRANIAL NERVE:   2nd - no papilledema on fundoscopic exam; DECR LIGHT SENS IN RIGHT EYE  2nd, 3rd, 4th, 6th - pupils equal and reactive to light, visual fields full to confrontation, extraocular muscles intact, no nystagmus; MILD SACCADIC BREAKDOWN OF SMOOTH PURSUIT  5th - facial sensation symmetric  7th - facial strength symmetric  8th - hearing intact  9th - palate elevates symmetrically, uvula midline  11th - shoulder shrug symmetric  12th - tongue protrusion midline  MOTOR:   normal bulk and tone, full strength in the BUE, BLE  SENSORY:   normal and symmetric to light touch, temperature, vibration  COORDINATION:   finger-nose-finger, fine finger movements normal  REFLEXES:   deep tendon reflexes present and symmetric; BRISK AT KNEES; TRACE AT ANKLES  GAIT/STATION:   narrow based gait      DIAGNOSTIC DATA (LABS, IMAGING, TESTING) - I reviewed patient records, labs, notes, testing and imaging myself where available.  Lab Results  Component Value Date   WBC 7.3 05/16/2016   HGB 14.8 05/16/2016   HCT 43.5 05/16/2016   MCV 85.1 05/16/2016   PLT 237 05/16/2016      Component Value Date/Time   NA 137 05/16/2016 2325   K 3.6 05/16/2016 2325   CL 105  05/16/2016 2325   CO2 22 05/16/2016 2325   GLUCOSE 151 (H) 05/16/2016 2325   BUN 19 05/16/2016 2325   CREATININE 1.34 (H) 05/16/2016 2325   CALCIUM 9.1 05/16/2016 2325   PROT 7.3 05/16/2016 2325   ALBUMIN 4.1 05/16/2016 2325   AST 25 05/16/2016 2325   ALT 18 05/16/2016 2325   ALKPHOS 67 05/16/2016 2325   BILITOT 0.5 05/16/2016 2325   GFRNONAA 54 (L) 05/16/2016 2325   GFRAA >60 05/16/2016 2325   No results found for: CHOL, HDL, LDLCALC, LDLDIRECT, TRIG, CHOLHDL No results found for: ACZY6A No results found for: VITAMINB12 No results found for: TSH   07/25/19 MRI lumbar spine [I reviewed images myself and agree with interpretation. -VRP]  1. Shallow left paracentral disc protrusion at T12-L1 but no significant neural compression.  2. Mild bilateral lateral recess encroachment at L2-3. 3. Mild spinal and moderate bilateral lateral recess stenosis at L3-4. 4. Degenerative anterolisthesis of L5 due to severe facet disease. No significant spinal or lateral recess stenosis. There is mild bilateral foraminal stenosis, right greater than left. 5. Age advanced aortoiliac calcifications are noted.  No aneurysm.     ASSESSMENT AND PLAN  68 y.o. year old male here with blurred vision gait difficulty numbness, low back pain.  Will proceed with further work-up with MRI of the brain to rule out demyelinating disease.  Dx:  1. Blurred vision, bilateral   2. Gait difficulty   3. Numbness     PLAN:  - check MRI brain w/wo  Orders Placed This Encounter  Procedures  . MR BRAIN W WO CONTRAST   Return pending testing,  for pending if symptoms worsen or fail to improve. pending test results    Suanne Marker, MD 08/11/2019, 3:53 PM Certified in Neurology, Neurophysiology and Neuroimaging  [facility] [address] [place], [place] [address] [phone]

## 2019-08-17 ENCOUNTER — Telehealth: Payer: Self-pay

## 2019-08-17 NOTE — Telephone Encounter (Signed)
[place] [id number]. Patient scheduled by GI for 09/03/19.

## 2019-09-01 ENCOUNTER — Encounter: Payer: Self-pay | Admitting: Orthopaedic Surgery

## 2019-09-01 ENCOUNTER — Other Ambulatory Visit: Payer: Self-pay

## 2019-09-01 ENCOUNTER — Ambulatory Visit: Payer: 59 | Admitting: Orthopaedic Surgery

## 2019-09-01 VITALS — BP 169/102 | HR 70 | Temp 97.1°F | Ht 76.0 in | Wt 193.2 lb

## 2019-09-01 DIAGNOSIS — M5441 Lumbago with sciatica, right side: Secondary | ICD-10-CM | POA: Diagnosis not present

## 2019-09-01 DIAGNOSIS — G8929 Other chronic pain: Secondary | ICD-10-CM | POA: Diagnosis not present

## 2019-09-01 NOTE — Progress Notes (Signed)
Patient John Proctor, male DOB:[birth year], 68 y.o. [id number]  Chief Complaint  Patient presents with  . Back Pain    feeling better    HPI  John Proctor is a 68 y.o. male who has chronic lower back pain.  He had the epidural about three weeks ago and is feeling better now.  He works 12 hour shifts.  He has pain after 8 hours.  He is taking his medicine and doing his exercises.  He has no weakness and is being active.   Body mass index is 23.52 kg/m.  ROS  Review of Systems  Constitutional: Positive for activity change.  Musculoskeletal: Positive for arthralgias, back pain and gait problem.  All other systems reviewed and are negative.   All other systems reviewed and are negative.  The following is a summary of the past history medically, past history surgically, known current medicines, social history and family history.  This information is gathered electronically by the computer from prior information and documentation.  I review this each visit and have found including this information at this point in the chart is beneficial and informative.    Past Medical History:  Diagnosis Date  . Hypertension     Past Surgical History:  Procedure Laterality Date  . ANKLE SURGERY     fusion    Family History  Problem Relation Age of Onset  . Multiple sclerosis Mother   . High blood pressure Father   . Heart attack Brother   . High blood pressure Brother     Social History Social History   Tobacco Use  . Smoking status: Former Smoker    Packs/day: 0.50    Years: 25.00    Pack years: 12.50    Types: Cigarettes    Quit date: 08/10/2012    Years since quitting: 7.0  . Smokeless tobacco: Never Used  Substance Use Topics  . Alcohol use: Yes    Alcohol/week: 3.0 standard drinks    Types: 3 Cans of beer per week    Comment: 08/11/19 per day  . Drug use: Never    No Known Allergies  Current Outpatient Medications  Medication Sig Dispense Refill  .  lisinopril (ZESTRIL) 40 MG tablet Take 40 mg by mouth daily.    . metoprolol succinate (TOPROL-XL) 25 MG 24 hr tablet Take 25 mg by mouth daily.    . naproxen sodium (ALEVE) 220 MG tablet Take 220 mg by mouth.    . rosuvastatin (CRESTOR) 20 MG tablet Take 20 mg by mouth daily.     No current facility-administered medications for this visit.      Physical Exam  Blood pressure (!) 169/102, pulse 70, temperature (!) 97.1 F (36.2 C), height 6\' 4"  (1.93 m), weight 193 lb 4 oz (87.7 kg).  Constitutional: overall normal hygiene, normal nutrition, well developed, normal grooming, normal body habitus. Assistive device:none  Musculoskeletal: gait and station Limp none, muscle tone and strength are normal, no tremors or atrophy is present.  .  Neurological: coordination overall normal.  Deep tendon reflex/nerve stretch intact.  Sensation normal.  Cranial nerves II-XII intact.   Skin:   Normal overall no scars, lesions, ulcers or rashes. No psoriasis.  Psychiatric: Alert and oriented x 3.  Recent memory intact, remote memory unclear.  Normal mood and affect. Well groomed.  Good eye contact.  Cardiovascular: overall no swelling, no varicosities, no edema bilaterally, normal temperatures of the legs and arms, no clubbing, cyanosis and good capillary refill.  Lymphatic: palpation is normal.  Spine/Pelvis examination:  Inspection:  Overall, sacoiliac joint benign and hips nontender; without crepitus or defects.   Thoracic spine inspection: Alignment normal without kyphosis present   Lumbar spine inspection:  Alignment  with normal lumbar lordosis, without scoliosis apparent.   Thoracic spine palpation:  without tenderness of spinal processes   Lumbar spine palpation: without tenderness of lumbar area; without tightness of lumbar muscles    Range of Motion:   Lumbar flexion, forward flexion is normal without pain or tenderness    Lumbar extension is full without pain or tenderness   Left  lateral bend is normal without pain or tenderness   Right lateral bend is normal without pain or tenderness   Straight leg raising is normal  Strength & tone: normal   Stability overall normal stability  All other systems reviewed and are negative   The patient has been educated about the nature of the problem(s) and counseled on treatment options.  The patient appeared to understand what I have discussed and is in agreement with it.  Encounter Diagnosis  Name Primary?  . Chronic right-sided low back pain with right-sided sciatica Yes    PLAN Call if any problems.  Precautions discussed.  Continue current medications.   Return to clinic 3 months   Electronically [place], MD 12/1/20208:15 AM

## 2019-09-03 ENCOUNTER — Ambulatory Visit
Admission: RE | Admit: 2019-09-03 | Discharge: 2019-09-03 | Disposition: A | Discharge status: Home / Self Care | Payer: 59 | Source: Ambulatory Visit | Attending: Diagnostic Neuroimaging | Admitting: Diagnostic Neuroimaging

## 2019-09-03 ENCOUNTER — Other Ambulatory Visit: Payer: Self-pay

## 2019-09-03 DIAGNOSIS — H538 Other visual disturbances: Secondary | ICD-10-CM

## 2019-09-03 DIAGNOSIS — R269 Unspecified abnormalities of gait and mobility: Secondary | ICD-10-CM

## 2019-09-03 DIAGNOSIS — R200 Anesthesia of skin: Secondary | ICD-10-CM

## 2019-09-03 MED ORDER — GADOBENATE DIMEGLUMINE 529 MG/ML IV SOLN
18.0000 mL | Freq: Once | INTRAVENOUS | Status: AC | PRN
  Administered 2019-09-03: 18 mL via INTRAVENOUS

## 2019-09-08 ENCOUNTER — Telehealth: Payer: Self-pay | Admitting: Orthopaedic Surgery

## 2019-09-08 MED ORDER — HYDROCODONE-ACETAMINOPHEN 5-325 MG PO TABS: ORAL_TABLET | ORAL | 0 refills | Status: DC

## 2019-09-08 NOTE — Telephone Encounter (Signed)
Patient requests refill on Oxycodone/Acetaminophen 7.5-325  Mgs.  Qty  56  Sig: Take 1 tablet by mouth every 6 (six) hours as needed for up to 14 days for moderate pain. One tablet every six hours as needed for pain. 14 day limit.  Patient states he uses [employer]

## 2019-09-22 ENCOUNTER — Telehealth: Payer: Self-pay | Admitting: Diagnostic Neuroimaging

## 2019-09-22 ENCOUNTER — Other Ambulatory Visit: Payer: Self-pay | Admitting: Diagnostic Neuroimaging

## 2019-09-22 DIAGNOSIS — R269 Unspecified abnormalities of gait and mobility: Secondary | ICD-10-CM

## 2019-09-22 DIAGNOSIS — R930 Abnormal findings on diagnostic imaging of skull and head, not elsewhere classified: Secondary | ICD-10-CM

## 2019-09-22 NOTE — Telephone Encounter (Signed)
Pt called wanting to know about his MRI results that he had of the brain. Please advise.

## 2019-09-22 NOTE — Telephone Encounter (Signed)
I spoke with patient and informed him that the MRI brain showed a few non-specific foci of gliosis (scar tissue). Dr Tish Frederickson stated there is no definite sign of  Multiple Sclerosis. Dr Leta Baptist has ordered MRI cervical / thoracic spine to follow up. I advised he will get a call to schedule them when insurance approves, and with holiday it will take longer. He stated he is willing to get it done wherever he can before the end of the year if possible due to cost. He  verbalized understanding, appreciation.

## 2019-09-22 NOTE — Telephone Encounter (Signed)
Will check additional scans. See result note. -VRP

## 2019-09-28 ENCOUNTER — Telehealth: Payer: Self-pay

## 2019-09-28 NOTE — Telephone Encounter (Signed)
CPT Code: [phone] (C-Spine) Case Number: [id number]  CPT Code: 91478 (T-Spine) Case Number: [id number]   Based on the clinical information provided, this request has not demonstrated consistency with evidence-based clinical guidelines.  A Physician-to-Physician discussion is required to complete the Notification Process. The ordering physician must call [phone] and select option #3 to engage in a Physician-to-Physician discussion. Please ensure the physician has the case number provided above available when making this call. If a Physician-to-Physician discussion is not conducted within 3 business days, this notification number request will be deemed expired and you must reinitiate the Notification Process.

## 2019-09-28 NOTE — Telephone Encounter (Signed)
Called patient and informed him of insurance requiring peer to peer before approving MRI cervical, thoracic spine. He asked why they are being done. I reviewed my phone conversation I had with him on 09/22/19. I advised ihm Dr Leta Baptist is out of the office this week. I will send to the work in MD. He stated understanding, thanked me for call.

## 2019-09-30 NOTE — Telephone Encounter (Signed)
Nicole: I did the Peer to peer.   The Authorization for the 2 studies is: [id number] (same number for both -- if you were given a separate one for T-spine do not use the old number, use this number instead for both)  Dr. Felecia Shelling

## 2019-10-05 NOTE — Telephone Encounter (Signed)
Thank you! We will obtain 2021 benefits and contact patient to schedule with mobile unit.

## 2019-11-03 ENCOUNTER — Telehealth: Payer: Self-pay | Admitting: Orthopaedic Surgery

## 2019-11-03 NOTE — Telephone Encounter (Signed)
Patient called to request refill. Last medication prescribed: HYDROcodone-acetaminophen (NORCO/VICODIN) 5-325 MG tablet / 56 tablets / [facility], [place]  - patient states "the Oxycodone prescribed prior to this medication worked better"  - please advise.

## 2019-11-04 MED ORDER — HYDROCODONE-ACETAMINOPHEN 5-325 MG PO TABS: ORAL_TABLET | ORAL | 0 refills | Status: DC

## 2019-12-01 ENCOUNTER — Ambulatory Visit: Payer: 59 | Admitting: Orthopaedic Surgery

## 2019-12-01 ENCOUNTER — Encounter: Payer: Self-pay | Admitting: Orthopaedic Surgery

## 2019-12-01 ENCOUNTER — Other Ambulatory Visit: Payer: Self-pay

## 2019-12-01 VITALS — BP 171/90 | HR 69 | Ht 76.0 in | Wt 200.0 lb

## 2019-12-01 DIAGNOSIS — M5441 Lumbago with sciatica, right side: Secondary | ICD-10-CM | POA: Diagnosis not present

## 2019-12-01 DIAGNOSIS — G8929 Other chronic pain: Secondary | ICD-10-CM

## 2019-12-01 MED ORDER — OXYCODONE-ACETAMINOPHEN 5-325 MG PO TABS: ORAL_TABLET | ORAL | 0 refills | Status: DC

## 2019-12-01 NOTE — Progress Notes (Signed)
Patient VQ:[id number] John Proctor, male DOB:Oct 02, 1950, 69 y.o. PJK:[id number]  Chief Complaint  Patient presents with  . Back Pain    better not going down leg anymore     HPI  John Proctor is a 69 y.o. male who has lower back pain with right sided sciatica.  He has good and bad days. He is taking his medicine.  He works 12 hour shifts. He has pain after about 6 to 8 hours at work.  He takes an Aleve which helps and his pain medicine later.  He has no new trauma.     Body mass index is 24.34 kg/m.  ROS  Review of Systems  Constitutional: Positive for activity change.  Musculoskeletal: Positive for arthralgias, back pain and gait problem.  All other systems reviewed and are negative.   All other systems reviewed and are negative.  The following is a summary of the past history medically, past history surgically, known current medicines, social history and family history.  This information is gathered electronically by the computer from prior information and documentation.  I review this each visit and have found including this information at this point in the chart is beneficial and informative.    Past Medical History:  Diagnosis Date  . Hypertension     Past Surgical History:  Procedure Laterality Date  . ANKLE SURGERY     fusion    Family History  Problem Relation Age of Onset  . Multiple sclerosis Mother   . High blood pressure Father   . Heart attack Brother   . High blood pressure Brother     Social History Social History   Tobacco Use  . Smoking status: Former Smoker    Packs/day: 0.50    Years: 25.00    Pack years: 12.50    Types: Cigarettes    Quit date: 08/10/2012    Years since quitting: 7.3  . Smokeless tobacco: Never Used  Substance Use Topics  . Alcohol use: Yes    Alcohol/week: 3.0 standard drinks    Types: 3 Cans of beer per week    Comment: 08/11/19 per day  . Drug use: Never    No Known Allergies  Current Outpatient Medications   Medication Sig Dispense Refill  . lisinopril (ZESTRIL) 40 MG tablet Take 40 mg by mouth daily.    . metoprolol succinate (TOPROL-XL) 25 MG 24 hr tablet Take 25 mg by mouth daily.    . naproxen sodium (ALEVE) 220 MG tablet Take 220 mg by mouth.    . rosuvastatin (CRESTOR) 20 MG tablet Take 20 mg by mouth daily.    Marland Kitchen oxyCODONE-acetaminophen (PERCOCET/ROXICET) 5-325 MG tablet One tablet every six hours for pain. 56 tablet 0   No current facility-administered medications for this visit.     Physical Exam  Blood pressure (!) 171/90, pulse 69, height 6\' 4"  (1.93 m), weight 200 lb (90.7 kg).  Constitutional: overall normal hygiene, normal nutrition, well developed, normal grooming, normal body habitus. Assistive device:none  Musculoskeletal: gait and station Limp none, muscle tone and strength are normal, no tremors or atrophy is present.  .  Neurological: coordination overall normal.  Deep tendon reflex/nerve stretch intact.  Sensation normal.  Cranial nerves II-XII intact.   Skin:   Normal overall no scars, lesions, ulcers or rashes. No psoriasis.  Psychiatric: Alert and oriented x 3.  Recent memory intact, remote memory unclear.  Normal mood and affect. Well groomed.  Good eye contact.  Cardiovascular: overall no swelling, no  varicosities, no edema bilaterally, normal temperatures of the legs and arms, no clubbing, cyanosis and good capillary refill.  Lymphatic: palpation is normal.  All other systems reviewed and are negative   The patient has been educated about the nature of the problem(s) and counseled on treatment options.  The patient appeared to understand what I have discussed and is in agreement with it.  Encounter Diagnosis  Name Primary?  . Chronic right-sided low back pain with right-sided sciatica Yes    PLAN Call if any problems.  Precautions discussed.  Continue current medications. I will change from Norco to oxycodone 5.   Return to clinic 3  months   Electronically Signed Darreld Mclean, MD 3/2/20218:13 AM

## 2020-01-26 ENCOUNTER — Other Ambulatory Visit: Payer: Self-pay | Admitting: Radiology

## 2020-01-26 MED ORDER — OXYCODONE-ACETAMINOPHEN 5-325 MG PO TABS: ORAL_TABLET | ORAL | 0 refills | Status: DC

## 2020-01-26 NOTE — Telephone Encounter (Signed)
Patient called, requests refill Oxycodone.    [facility].

## 2020-03-01 ENCOUNTER — Ambulatory Visit: Payer: 59 | Admitting: Orthopaedic Surgery

## 2020-03-01 ENCOUNTER — Other Ambulatory Visit: Payer: Self-pay

## 2020-03-01 ENCOUNTER — Encounter: Payer: Self-pay | Admitting: Orthopaedic Surgery

## 2020-03-01 ENCOUNTER — Other Ambulatory Visit: Payer: Self-pay | Admitting: Orthopaedic Surgery

## 2020-03-01 VITALS — BP 187/114 | HR 73 | Ht 76.0 in | Wt 198.0 lb

## 2020-03-01 DIAGNOSIS — M5441 Lumbago with sciatica, right side: Secondary | ICD-10-CM | POA: Diagnosis not present

## 2020-03-01 DIAGNOSIS — G8929 Other chronic pain: Secondary | ICD-10-CM

## 2020-03-01 MED ORDER — OXYCODONE-ACETAMINOPHEN 5-325 MG PO TABS: ORAL_TABLET | ORAL | 0 refills | Status: DC

## 2020-03-01 MED ORDER — PREDNISONE 5 MG (21) PO TBPK: ORAL_TABLET | ORAL | 0 refills | Status: DC

## 2020-03-01 NOTE — Addendum Note (Signed)
Addended by: Jodene Nam A on: 03/01/2020 11:38 AM   Modules accepted: Orders

## 2020-03-01 NOTE — Progress Notes (Signed)
Patient John Proctor, male DOB:12-03-1950, 69 y.o. [id number]  Chief Complaint  Patient presents with  . Back Pain    R/side in hip hurts/can hardly walk about mid afternoon because of  pain    HPI  John Proctor is a 69 y.o. male who has recurrence of his lower back pain with right sided sciatica.  He has less pain in the morning but as he does his 12 hour shift he has more and more pain.  He has pain running down the right hip to the thigh to below the right knee.  He is taking his pain medicine and doing his exercises.  He had an epidural in December 2020 which lasted about three months before his pain came back.  I will have him set up for new epidural.  I will give refill on pain medicine and give prednisone dose pack.   Body mass index is 24.1 kg/m.  ROS  Review of Systems  Constitutional: Positive for activity change.  Musculoskeletal: Positive for arthralgias, back pain and gait problem.  All other systems reviewed and are negative.   All other systems reviewed and are negative.  The following is a summary of the past history medically, past history surgically, known current medicines, social history and family history.  This information is gathered electronically by the computer from prior information and documentation.  I review this each visit and have found including this information at this point in the chart is beneficial and informative.    Past Medical History:  Diagnosis Date  . Hypertension     Past Surgical History:  Procedure Laterality Date  . ANKLE SURGERY     fusion    Family History  Problem Relation Age of Onset  . Multiple sclerosis Mother   . High blood pressure Father   . Heart attack Brother   . High blood pressure Brother     Social History Social History   Tobacco Use  . Smoking status: Former Smoker    Packs/day: 0.50    Years: 25.00    Pack years: 12.50    Types: Cigarettes    Quit date: 08/10/2012    Years  since quitting: 7.5  . Smokeless tobacco: Never Used  Substance Use Topics  . Alcohol use: Yes    Alcohol/week: 3.0 standard drinks    Types: 3 Cans of beer per week    Comment: 08/11/19 per day  . Drug use: Never    No Known Allergies  Current Outpatient Medications  Medication Sig Dispense Refill  . lisinopril (ZESTRIL) 40 MG tablet Take 40 mg by mouth daily.    . metoprolol succinate (TOPROL-XL) 25 MG 24 hr tablet Take 25 mg by mouth daily.    . naproxen sodium (ALEVE) 220 MG tablet Take 220 mg by mouth.    . oxyCODONE-acetaminophen (PERCOCET/ROXICET) 5-325 MG tablet One tablet every six hours for pain. 100 tablet 0  . predniSONE (STERAPRED UNI-PAK 21 TAB) 5 MG (21) TBPK tablet Take 6 pills first day; 5 pills second day; 4 pills third day; 3 pills fourth day; 2 pills next day and 1 pill last day. 21 tablet 0  . rosuvastatin (CRESTOR) 20 MG tablet Take 20 mg by mouth daily.     No current facility-administered medications for this visit.     Physical Exam  Blood pressure (!) 187/114, pulse 73, height 6\' 4"  (1.93 m), weight 198 lb (89.8 kg).  Constitutional: overall normal hygiene, normal nutrition, well developed, normal  grooming, normal body habitus. Assistive device:none  Musculoskeletal: gait and station Limp none, muscle tone and strength are normal, no tremors or atrophy is present.  .  Neurological: coordination overall normal.  Deep tendon reflex/nerve stretch intact.  Sensation normal.  Cranial nerves II-XII intact.   Skin:   Normal overall no scars, lesions, ulcers or rashes. No psoriasis.  Psychiatric: Alert and oriented x 3.  Recent memory intact, remote memory unclear.  Normal mood and affect. Well groomed.  Good eye contact.  Cardiovascular: overall no swelling, no varicosities, no edema bilaterally, normal temperatures of the legs and arms, no clubbing, cyanosis and good capillary refill.  Lymphatic: palpation is normal.  Spine/Pelvis  examination:  Inspection:  Overall, sacoiliac joint benign and hips nontender; without crepitus or defects.   Thoracic spine inspection: Alignment normal without kyphosis present   Lumbar spine inspection:  Alignment  with normal lumbar lordosis, without scoliosis apparent.   Thoracic spine palpation:  without tenderness of spinal processes   Lumbar spine palpation: without tenderness of lumbar area; without tightness of lumbar muscles    Range of Motion:   Lumbar flexion, forward flexion is normal without pain or tenderness    Lumbar extension is full without pain or tenderness   Left lateral bend is normal without pain or tenderness   Right lateral bend is normal without pain or tenderness   Straight leg raising is normal  Strength & tone: normal   Stability overall normal stability  All other systems reviewed and are negative   The patient has been educated about the nature of the problem(s) and counseled on treatment options.  The patient appeared to understand what I have discussed and is in agreement with it.  Encounter Diagnosis  Name Primary?  . Chronic right-sided low back pain with right-sided sciatica Yes    PLAN Call if any problems.  Precautions discussed.  Continue current medications. I have given Rx for prednisone dose pack also today.  Return to clinic 1 month   Set up new epidural.  I have reviewed the [facility] web site prior to prescribing narcotic medicine for this patient.   Electronically Signed Sanjuana Kava, MD 6/1/20218:40 AM

## 2020-03-11 ENCOUNTER — Ambulatory Visit
Admission: RE | Admit: 2020-03-11 | Discharge: 2020-03-11 | Disposition: A | Discharge status: Home / Self Care | Payer: 59 | Source: Ambulatory Visit | Attending: Orthopaedic Surgery | Admitting: Orthopaedic Surgery

## 2020-03-11 ENCOUNTER — Other Ambulatory Visit: Payer: Self-pay

## 2020-03-11 DIAGNOSIS — G8929 Other chronic pain: Secondary | ICD-10-CM

## 2020-03-11 MED ORDER — METHYLPREDNISOLONE ACETATE 40 MG/ML INJ SUSP (RADIOLOG
120.0000 mg | Freq: Once | INTRAMUSCULAR | Status: AC
  Administered 2020-03-11: 120 mg via EPIDURAL

## 2020-03-11 MED ORDER — IOPAMIDOL (ISOVUE-M 200) INJECTION 41%
1.0000 mL | Freq: Once | INTRAMUSCULAR | Status: AC
  Administered 2020-03-11: 1 mL via EPIDURAL

## 2020-03-11 NOTE — Discharge Instructions (Signed)

## 2020-04-05 ENCOUNTER — Telehealth: Payer: Self-pay | Admitting: Orthopaedic Surgery

## 2020-04-05 MED ORDER — OXYCODONE-ACETAMINOPHEN 5-325 MG PO TABS: ORAL_TABLET | ORAL | 0 refills | Status: DC

## 2020-04-05 NOTE — Telephone Encounter (Signed)
Patient requests refill on Oxycodone/Actaminophen 5-325  Mgs.  Qty  100  Sig: One tablet every six hours for pain.  Patient states he uses [facility] 

## 2020-05-24 ENCOUNTER — Other Ambulatory Visit: Payer: Self-pay | Admitting: Orthopaedic Surgery

## 2020-05-25 MED ORDER — OXYCODONE-ACETAMINOPHEN 5-325 MG PO TABS: ORAL_TABLET | ORAL | 0 refills | Status: DC

## 2020-05-25 NOTE — Telephone Encounter (Signed)
Patient of Dr Hilda Lias requests refill - aware requests are being forwarded to Dr Romeo Apple for review while Dr Hilda Lias is out of clinic this week: oxyCODONE-acetaminophen (PERCOCET/ROXICET) 5-325 MG tablet 100 tablet  -[facility]

## 2020-06-02 ENCOUNTER — Other Ambulatory Visit: Payer: Self-pay

## 2020-06-02 ENCOUNTER — Ambulatory Visit (INDEPENDENT_AMBULATORY_CARE_PROVIDER_SITE_OTHER): Payer: 59 | Admitting: Orthopaedic Surgery

## 2020-06-02 ENCOUNTER — Encounter: Payer: Self-pay | Admitting: Orthopaedic Surgery

## 2020-06-02 VITALS — BP 183/106 | HR 79 | Ht 76.0 in | Wt 198.0 lb

## 2020-06-02 DIAGNOSIS — G8929 Other chronic pain: Secondary | ICD-10-CM | POA: Diagnosis not present

## 2020-06-02 DIAGNOSIS — M5441 Lumbago with sciatica, right side: Secondary | ICD-10-CM | POA: Diagnosis not present

## 2020-06-02 NOTE — Progress Notes (Signed)
Patient IR:SWNIOEV [name], male DOB:11-Aug-1951, 69 y.o. [id number]  Chief Complaint  Patient presents with  . Back Pain    had ESI states only lasts a couple months     HPI  John Proctor is a 69 y.o. male who has continued lower back pain.  He got epidural on 03-11-2020 and it did well for two months but the pain has returned.  He is taking his pain medicine.  He is tired of having the back pain.  He would like to consider possible surgery now.  I will reschedule a new epidural.  I will also have him see neurosurgeon to see if he is a surgical candidate.  His pain is in the lower back with right sided paresthesias.  He is working still.   Body mass index is 24.1 kg/m.  ROS  Review of Systems  Constitutional: Positive for activity change.  Musculoskeletal: Positive for arthralgias, back pain and gait problem.  All other systems reviewed and are negative.   All other systems reviewed and are negative.  The following is a summary of the past history medically, past history surgically, known current medicines, social history and family history.  This information is gathered electronically by the computer from prior information and documentation.  I review this each visit and have found including this information at this point in the chart is beneficial and informative.    Past Medical History:  Diagnosis Date  . Hypertension     Past Surgical History:  Procedure Laterality Date  . ANKLE SURGERY     fusion    Family History  Problem Relation Age of Onset  . Multiple sclerosis Mother   . High blood pressure Father   . Heart attack Brother   . High blood pressure Brother     Social History Social History   Tobacco Use  . Smoking status: Former Smoker    Packs/day: 0.50    Years: 25.00    Pack years: 12.50    Types: Cigarettes    Quit date: 08/10/2012    Years since quitting: 7.8  . Smokeless tobacco: Never Used  Substance Use Topics  . Alcohol use:  Yes    Alcohol/week: 3.0 standard drinks    Types: 3 Cans of beer per week    Comment: 08/11/19 per day  . Drug use: Never    No Known Allergies  Current Outpatient Medications  Medication Sig Dispense Refill  . gabapentin (NEURONTIN) 100 MG capsule Take 100 mg by mouth 3 (three) times daily.    . naproxen sodium (ALEVE) 220 MG tablet Take 220 mg by mouth.    . oxyCODONE-acetaminophen (PERCOCET/ROXICET) 5-325 MG tablet One tablet every six hours for pain. 100 tablet 0  . rosuvastatin (CRESTOR) 20 MG tablet Take 20 mg by mouth daily.    Marland Kitchen lisinopril (ZESTRIL) 40 MG tablet Take 40 mg by mouth daily. (Patient not taking: Reported on 06/02/2020)    . metoprolol succinate (TOPROL-XL) 25 MG 24 hr tablet Take 25 mg by mouth daily. (Patient not taking: Reported on 06/02/2020)     No current facility-administered medications for this visit.     Physical Exam  Blood pressure (!) 183/106, pulse 79, height 6\' 4"  (1.93 m), weight 198 lb (89.8 kg).  Constitutional: overall normal hygiene, normal nutrition, well developed, normal grooming, normal body habitus. Assistive device:none  Musculoskeletal: gait and station Limp none, muscle tone and strength are normal, no tremors or atrophy is present.  .  Neurological:  coordination overall normal.  Deep tendon reflex/nerve stretch intact.  Sensation normal.  Cranial nerves II-XII intact.   Skin:   Normal overall no scars, lesions, ulcers or rashes. No psoriasis.  Psychiatric: Alert and oriented x 3.  Recent memory intact, remote memory unclear.  Normal mood and affect. Well groomed.  Good eye contact.  Cardiovascular: overall no swelling, no varicosities, no edema bilaterally, normal temperatures of the legs and arms, no clubbing, cyanosis and good capillary refill.  Lymphatic: palpation is normal.  Spine/Pelvis examination:  Inspection:  Overall, sacoiliac joint benign and hips nontender; without crepitus or defects.   Thoracic spine inspection:  Alignment normal without kyphosis present   Lumbar spine inspection:  Alignment  with normal lumbar lordosis, without scoliosis apparent.   Thoracic spine palpation:  without tenderness of spinal processes   Lumbar spine palpation: without tenderness of lumbar area; without tightness of lumbar muscles    Range of Motion:   Lumbar flexion, forward flexion is normal without pain or tenderness    Lumbar extension is full without pain or tenderness   Left lateral bend is normal without pain or tenderness   Right lateral bend is normal without pain or tenderness   Straight leg raising is normal  Strength & tone: normal   Stability overall normal stability All other systems reviewed and are negative   The patient has been educated about the nature of the problem(s) and counseled on treatment options.  The patient appeared to understand what I have discussed and is in agreement with it.  Encounter Diagnosis  Name Primary?  . Chronic right-sided low back pain with right-sided sciatica Yes    PLAN Call if any problems.  Precautions discussed.  Continue current medications.   Return to clinic 3 months   Electronically Signed Darreld Mclean, MD 9/2/20218:12 AM

## 2020-06-02 NOTE — Addendum Note (Signed)
Addended by: Cherre Huger E on: 06/02/2020 08:34 AM   Modules accepted: Orders

## 2020-06-08 ENCOUNTER — Telehealth: Payer: Self-pay | Admitting: Orthopaedic Surgery

## 2020-06-08 DIAGNOSIS — G8929 Other chronic pain: Secondary | ICD-10-CM

## 2020-06-08 NOTE — Telephone Encounter (Signed)
I have placed the order for the epidural injections at [facility]. Jenel Lucks has been called and I left the information for her to call the patient and schedule. Patient is aware and did not have any questions.

## 2020-06-08 NOTE — Telephone Encounter (Signed)
Patient called and stated he does not want to go to Dr Alvester Morin for injections.  He prefers to go to [facility].  Can you guys send an order to [facility] for him?  Thanks

## 2020-06-09 ENCOUNTER — Other Ambulatory Visit: Payer: Self-pay | Admitting: Orthopaedic Surgery

## 2020-06-09 DIAGNOSIS — M5441 Lumbago with sciatica, right side: Secondary | ICD-10-CM

## 2020-06-15 ENCOUNTER — Other Ambulatory Visit: Payer: 59

## 2020-06-17 ENCOUNTER — Other Ambulatory Visit: Payer: Self-pay

## 2020-06-17 ENCOUNTER — Ambulatory Visit
Admission: RE | Admit: 2020-06-17 | Discharge: 2020-06-17 | Disposition: A | Discharge status: Home / Self Care | Payer: 59 | Source: Ambulatory Visit | Attending: Orthopaedic Surgery | Admitting: Orthopaedic Surgery

## 2020-06-17 DIAGNOSIS — M5441 Lumbago with sciatica, right side: Secondary | ICD-10-CM

## 2020-06-17 MED ORDER — METHYLPREDNISOLONE ACETATE 40 MG/ML INJ SUSP (RADIOLOG
120.0000 mg | Freq: Once | INTRAMUSCULAR | Status: AC
  Administered 2020-06-17: 120 mg via EPIDURAL

## 2020-06-17 MED ORDER — IOPAMIDOL (ISOVUE-M 200) INJECTION 41%
1.0000 mL | Freq: Once | INTRAMUSCULAR | Status: AC
  Administered 2020-06-17: 1 mL via EPIDURAL

## 2020-07-05 ENCOUNTER — Telehealth: Payer: Self-pay | Admitting: Orthopaedic Surgery

## 2020-07-05 MED ORDER — OXYCODONE-ACETAMINOPHEN 5-325 MG PO TABS: ORAL_TABLET | ORAL | 0 refills | Status: DC

## 2020-07-05 NOTE — Telephone Encounter (Signed)
Patient requests refill on Oxycodone/Actaminophen 5-325  Mgs.  Qty  100  Sig: One tablet every six hours for pain.  Patient states he uses [facility] 

## 2020-07-07 IMAGING — MR MR LUMBAR SPINE W/O CM
4 of 5 series · 18 of 48 positions shown · non-contrast
Comparison: Lumbar radiographs 07/14/2019

CLINICAL DATA: Low back and right hip pain for 1 month. Numbness in
right lower extremity.

EXAM:
MRI LUMBAR SPINE WITHOUT CONTRAST
TECHNIQUE: Multiplanar, multisequence MR imaging of the lumbar spine was
performed. No intravenous contrast was administered.

[Series 5: T2 · sagittal · 4.0mm · 0.73mm/px · 6 of 15 slices shown (1 of 2)]
[im 1/15]
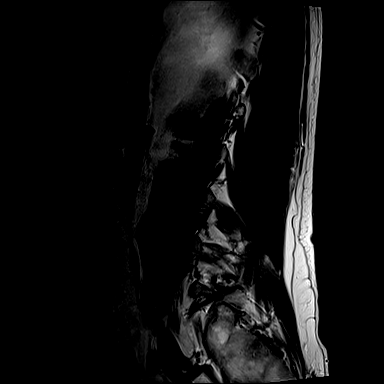
[im 3/15]
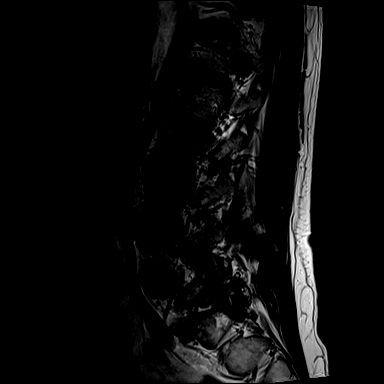
[im 6/15]
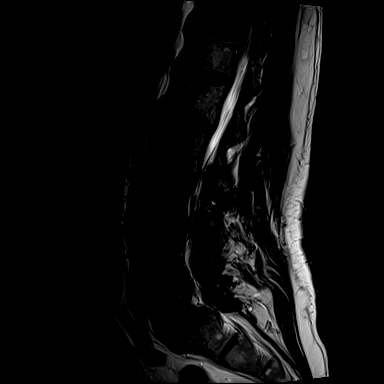
[im 9/15]
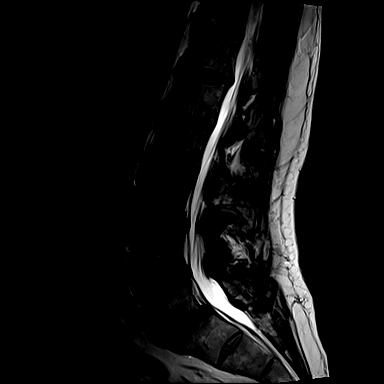
[im 12/15]
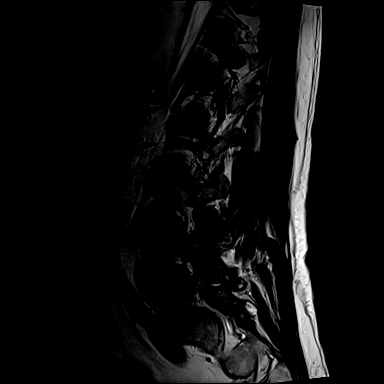
[im 15/15]
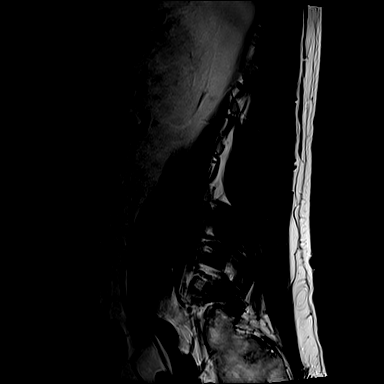

[Series 6: T1 · sagittal · 4.0mm · 0.73mm/px · 3 of 15 slices shown (1 of 2)]
[im 1/15]
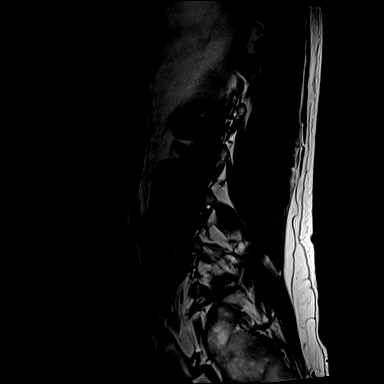
[im 8/15]
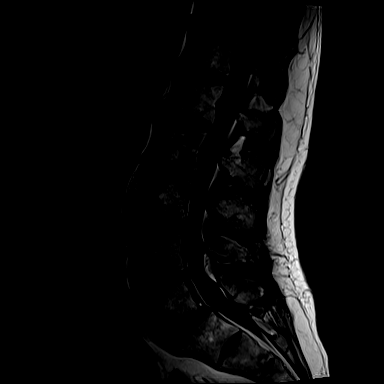
[im 15/15]
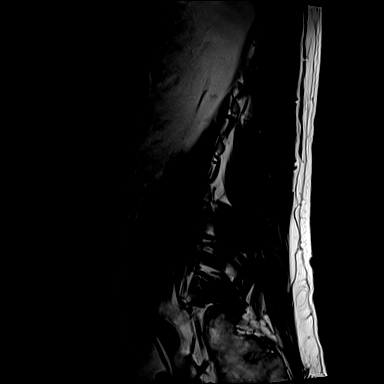

[Series 10: T1 · axial · 4.0mm · 0.28mm/px · z∈[-118,+68]mm · 3 of 48 slices shown (2 of 2)]
[im 7/48]
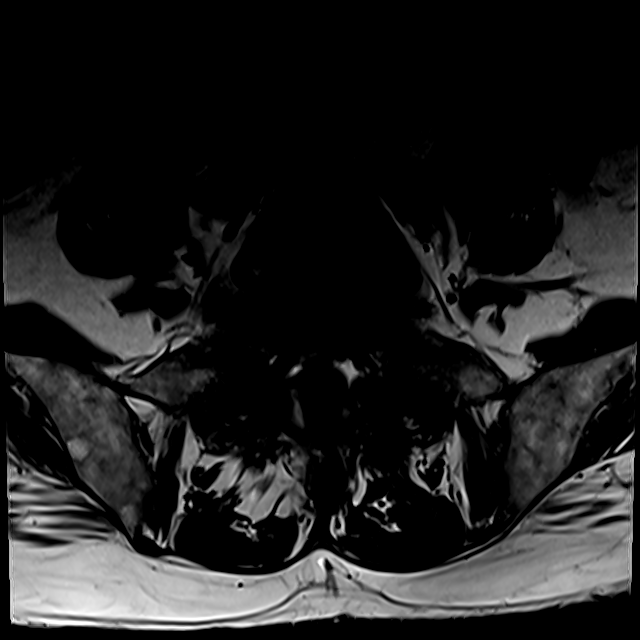
[im 26/48]
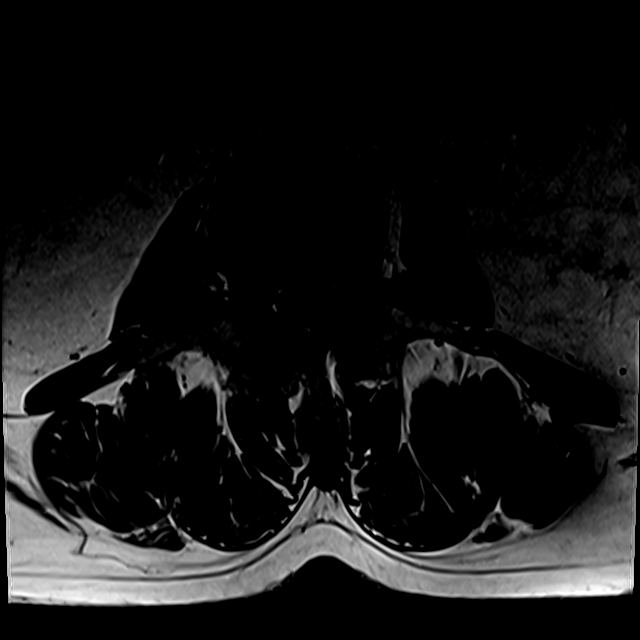
[im 41/48]
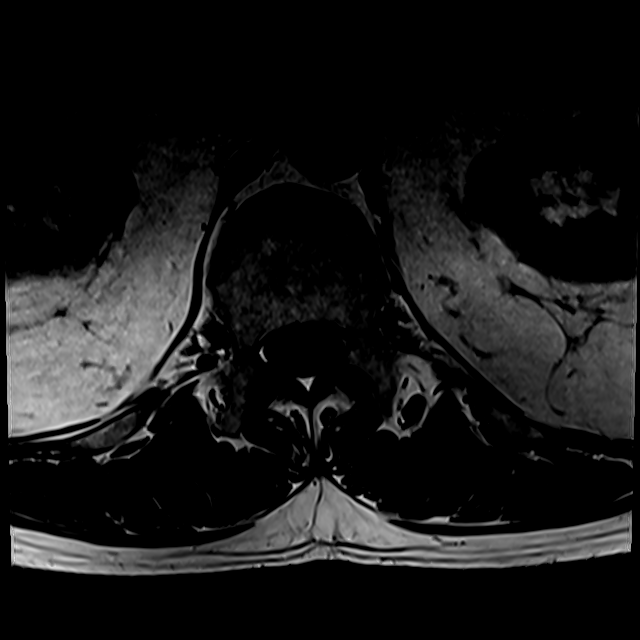

[Series 13: T2 · axial · 4.0mm · 0.28mm/px · z∈[-132,+68]mm · 6 of 48 slices shown (2 of 2)]
[im 4/48]
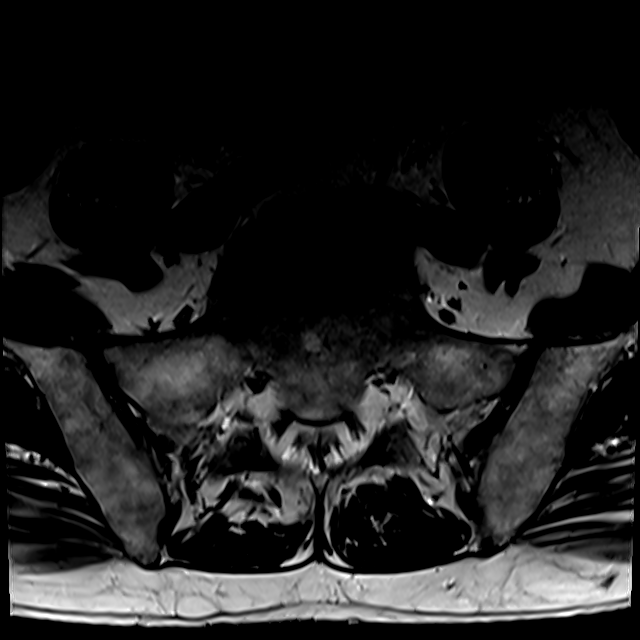
[im 7/48]
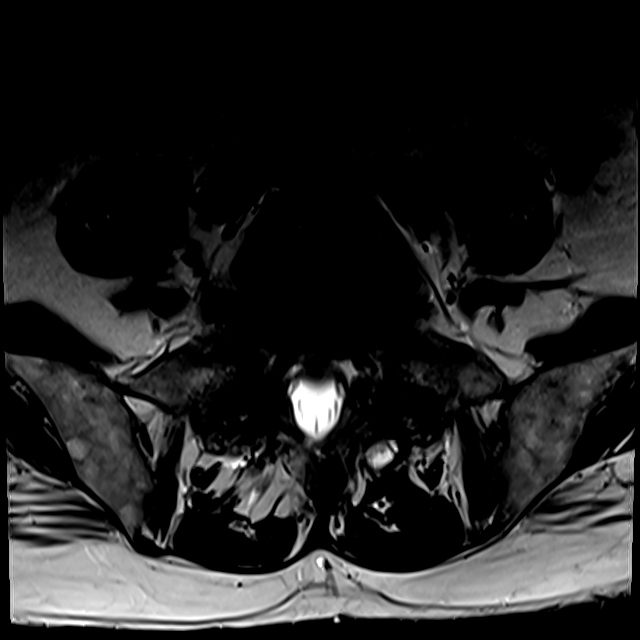
[im 10/48]
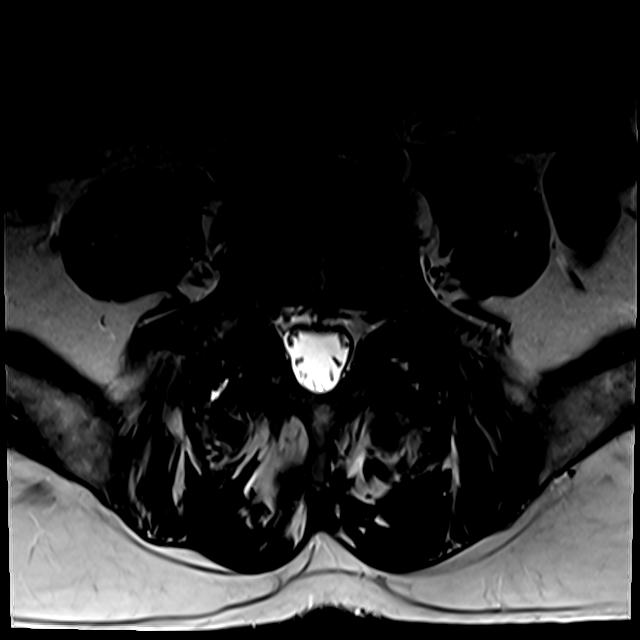
[im 16/48]
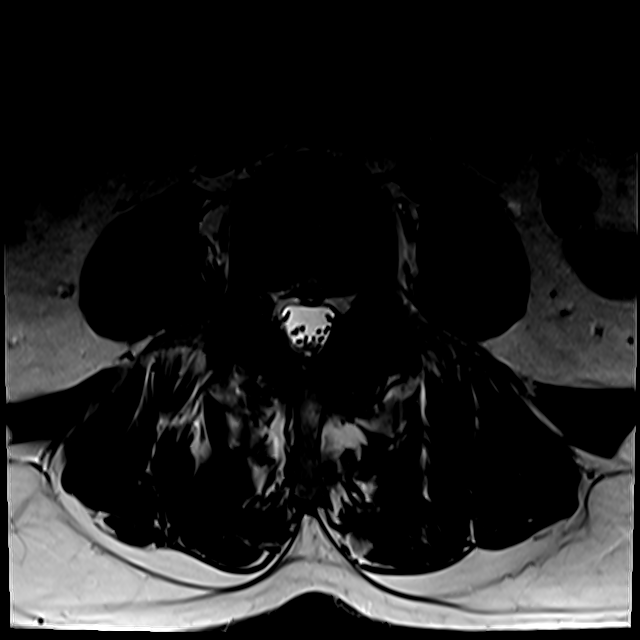
[im 26/48]
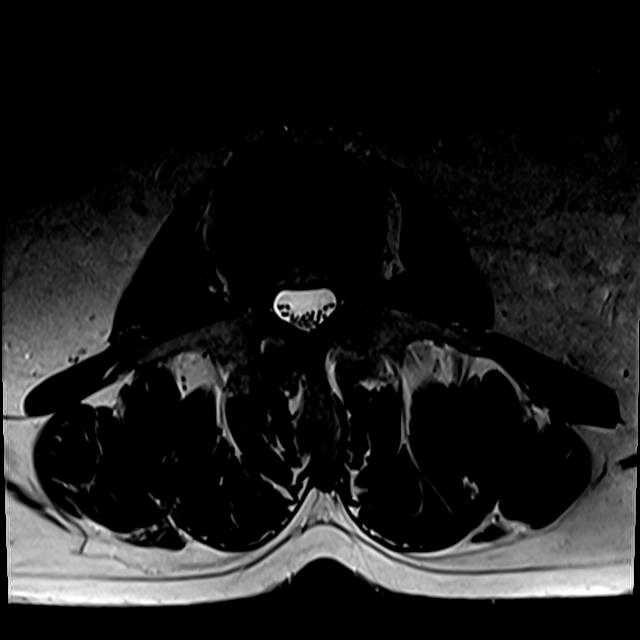
[im 41/48]
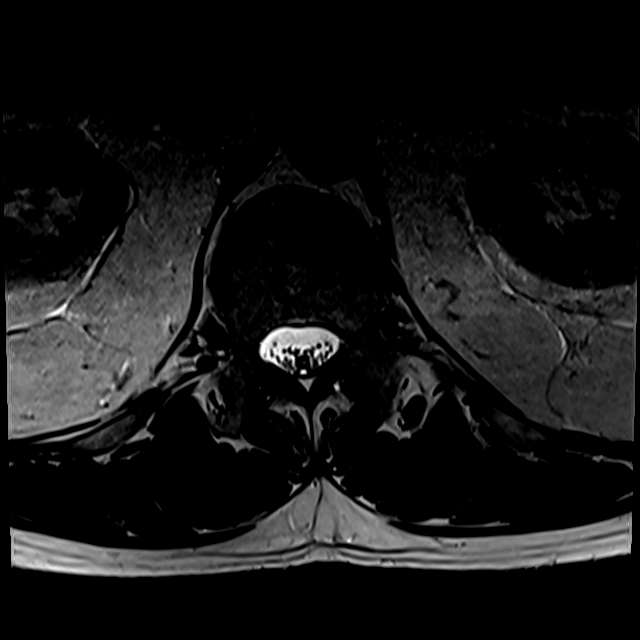

[18 of 48 positions shown; findings below may reference images not displayed]

FINDINGS: Segmentation: There are five lumbar type vertebral bodies. The last
full intervertebral disc space is labeled L5-S1. This correlates
with the lumbar radiographs.

Alignment: Normal overall alignment. There is mild degenerative
anterolisthesis of L5 due to advanced facet disease but no definite
pars defects. The other vertebral bodies are normally aligned.

Vertebrae:  Normal marrow signal.  No bone lesions or fractures.

Conus medullaris and cauda equina: Conus extends to the L1 level.
Conus and cauda equina appear normal.

Paraspinal and other soft tissues: No significant findings.

Disc levels:

T12-L1: Shallow left paracentral disc protrusion with minimal
impression on the thecal sac. No spinal or foraminal stenosis.

L1-2: Mild facet disease but no disc protrusions, spinal or
foraminal stenosis.

L2-3: Mild annular bulge and moderate facet disease with mild
bilateral lateral recess encroachment but no significant spinal or
foraminal stenosis.

L3-4: Shallow broad-based disc protrusion, advanced facet disease
and ligamentum flavum thickening contributing to mild spinal and
moderate bilateral lateral recess stenosis. No foraminal stenosis.

L4-5: Bulging annulus, advanced facet disease and ligamentum flavum
thickening but no significant spinal, lateral recess or foraminal
stenosis. A left-sided synovial cyst is noted but this is projecting
posteriorly.

L5-S1: Severe facet disease with degenerative anterolisthesis of L5.
There is a bulging uncovered disc but the canal is widened and there
is no significant canal stenosis. Mild bilateral foraminal stenosis,
right greater than left.
IMPRESSION: 1. Shallow left paracentral disc protrusion at T12-L1 but no
significant neural compression.
2. Mild bilateral lateral recess encroachment at L2-3.
3. Mild spinal and moderate bilateral lateral recess stenosis at
L3-4.
4. Degenerative anterolisthesis of L5 due to severe facet disease.
No significant spinal or lateral recess stenosis. There is mild
bilateral foraminal stenosis, right greater than left.
5. Age advanced aortoiliac calcifications are noted.  No aneurysm.

## 2020-08-02 ENCOUNTER — Telehealth: Payer: Self-pay | Admitting: Orthopaedic Surgery

## 2020-08-02 MED ORDER — OXYCODONE-ACETAMINOPHEN 5-325 MG PO TABS: ORAL_TABLET | ORAL | 0 refills | Status: DC

## 2020-08-02 NOTE — Telephone Encounter (Signed)
Patient requests refill on Oxycodone/Actaminophen 5-325 Mgs. Qty 100  Sig:One tablet every six hours for pain.  Patient states he uses [facility] 

## 2020-09-01 ENCOUNTER — Ambulatory Visit (INDEPENDENT_AMBULATORY_CARE_PROVIDER_SITE_OTHER): Payer: 59 | Admitting: Orthopaedic Surgery

## 2020-09-01 ENCOUNTER — Encounter: Payer: Self-pay | Admitting: Orthopaedic Surgery

## 2020-09-01 ENCOUNTER — Other Ambulatory Visit: Payer: Self-pay

## 2020-09-01 VITALS — Ht 76.0 in | Wt 193.0 lb

## 2020-09-01 DIAGNOSIS — M5441 Lumbago with sciatica, right side: Secondary | ICD-10-CM

## 2020-09-01 DIAGNOSIS — G8929 Other chronic pain: Secondary | ICD-10-CM

## 2020-09-01 MED ORDER — OXYCODONE-ACETAMINOPHEN 5-325 MG PO TABS: ORAL_TABLET | ORAL | 0 refills | Status: DC

## 2020-09-01 NOTE — Progress Notes (Signed)
Patient John Proctor, male DOB:04-Feb-1951, 69 y.o. [id number]  Chief Complaint  Patient presents with  . Back Pain    seems to be alittle worse pain radiating down right legf    HPI  John Proctor is a 69 y.o. male who has chronic lower back pain.  He had the epidural done but it did not help much.  He saw Dr. Dutch Quint, neurosurgery.  He had arranged to have fusion of the lower back but due to insurance reasons it had to be postponed until the first of the new year.  He has pain.  He still is working but says it is really bad at the end of the shift.  He will work with Dr. Dutch Quint to have surgery in January.  He is taking his medicine which helps. I have reviewed notes of the epidural and visit.   Body mass index is 23.49 kg/m.  ROS  Review of Systems  Constitutional: Positive for activity change.  Musculoskeletal: Positive for arthralgias, back pain and gait problem.  All other systems reviewed and are negative.   All other systems reviewed and are negative.  The following is a summary of the past history medically, past history surgically, known current medicines, social history and family history.  This information is gathered electronically by the computer from prior information and documentation.  I review this each visit and have found including this information at this point in the chart is beneficial and informative.    Past Medical History:  Diagnosis Date  . Hypertension     Past Surgical History:  Procedure Laterality Date  . ANKLE SURGERY     fusion    Family History  Problem Relation Age of Onset  . Multiple sclerosis Mother   . High blood pressure Father   . Heart attack Brother   . High blood pressure Brother     Social History Social History   Tobacco Use  . Smoking status: Former Smoker    Packs/day: 0.50    Years: 25.00    Pack years: 12.50    Types: Cigarettes    Quit date: 08/10/2012    Years since quitting: 8.0  . Smokeless  tobacco: Never Used  Substance Use Topics  . Alcohol use: Yes    Alcohol/week: 3.0 standard drinks    Types: 3 Cans of beer per week    Comment: 08/11/19 per day  . Drug use: Never    No Known Allergies  Current Outpatient Medications  Medication Sig Dispense Refill  . gabapentin (NEURONTIN) 100 MG capsule Take 100 mg by mouth 3 (three) times daily.    Marland Kitchen lisinopril (ZESTRIL) 40 MG tablet Take 40 mg by mouth daily. (Patient not taking: Reported on 06/02/2020)    . metoprolol succinate (TOPROL-XL) 25 MG 24 hr tablet Take 25 mg by mouth daily. (Patient not taking: Reported on 06/02/2020)    . naproxen sodium (ALEVE) 220 MG tablet Take 220 mg by mouth.    . oxyCODONE-acetaminophen (PERCOCET/ROXICET) 5-325 MG tablet One tablet every six hours for pain. 100 tablet 0  . rosuvastatin (CRESTOR) 20 MG tablet Take 20 mg by mouth daily.     No current facility-administered medications for this visit.     Physical Exam  Height 6\' 4"  (1.93 m), weight 193 lb (87.5 kg).  Constitutional: overall normal hygiene, normal nutrition, well developed, normal grooming, normal body habitus. Assistive device:none  Musculoskeletal: gait and station Limp none, muscle tone and strength are normal, no tremors  or atrophy is present.  .  Neurological: coordination overall normal.  Deep tendon reflex/nerve stretch intact.  Sensation normal.  Cranial nerves II-XII intact.   Skin:   Normal overall no scars, lesions, ulcers or rashes. No psoriasis.  Psychiatric: Alert and oriented x 3.  Recent memory intact, remote memory unclear.  Normal mood and affect. Well groomed.  Good eye contact.  Cardiovascular: overall no swelling, no varicosities, no edema bilaterally, normal temperatures of the legs and arms, no clubbing, cyanosis and good capillary refill.  Lymphatic: palpation is normal.  Spine/Pelvis examination:  Inspection:  Overall, sacoiliac joint benign and hips nontender; without crepitus or  defects.   Thoracic spine inspection: Alignment normal without kyphosis present   Lumbar spine inspection:  Alignment  with normal lumbar lordosis, without scoliosis apparent.   Thoracic spine palpation:  without tenderness of spinal processes   Lumbar spine palpation: without tenderness of lumbar area; without tightness of lumbar muscles    Range of Motion:   Lumbar flexion, forward flexion is normal without pain or tenderness    Lumbar extension is full without pain or tenderness   Left lateral bend is normal without pain or tenderness   Right lateral bend is normal without pain or tenderness   Straight leg raising is normal  Strength & tone: normal   Stability overall normal stability  All other systems reviewed and are negative   The patient has been educated about the nature of the problem(s) and counseled on treatment options.  The patient appeared to understand what I have discussed and is in agreement with it.  Encounter Diagnosis  Name Primary?  . Chronic right-sided low back pain with right-sided sciatica Yes    PLAN Call if any problems.  Precautions discussed.  Continue current medications.   Return to clinic 3 months   I have reviewed the [facility] Controlled Substance Reporting System web site prior to prescribing narcotic medicine for this patient.   Electronically Signed Darreld Mclean, MD 12/2/20218:20 AM

## 2020-10-09 ENCOUNTER — Other Ambulatory Visit: Payer: Self-pay

## 2020-10-09 ENCOUNTER — Encounter: Payer: Self-pay | Admitting: Emergency Medicine

## 2020-10-09 ENCOUNTER — Ambulatory Visit
Admission: EM | Admit: 2020-10-09 | Discharge: 2020-10-09 | Disposition: A | Discharge status: Home / Self Care | Payer: Medicare HMO | Attending: Family Medicine | Admitting: Family Medicine

## 2020-10-09 DIAGNOSIS — R059 Cough, unspecified: Secondary | ICD-10-CM | POA: Diagnosis not present

## 2020-10-09 DIAGNOSIS — R519 Headache, unspecified: Secondary | ICD-10-CM

## 2020-10-09 DIAGNOSIS — J3489 Other specified disorders of nose and nasal sinuses: Secondary | ICD-10-CM

## 2020-10-09 DIAGNOSIS — Z9189 Other specified personal risk factors, not elsewhere classified: Secondary | ICD-10-CM | POA: Diagnosis not present

## 2020-10-09 DIAGNOSIS — B349 Viral infection, unspecified: Secondary | ICD-10-CM

## 2020-10-09 DIAGNOSIS — I10 Essential (primary) hypertension: Secondary | ICD-10-CM

## 2020-10-09 NOTE — ED Provider Notes (Signed)
[facility]   [id number] 10/09/20 Arrival Time: 0934   CC: COVID symptoms  SUBJECTIVE: History from: patient.  John Proctor is a 70 y.o. male who presents with headache, sore throat, cough and rhinorrhea x 2 days. Denies sick exposure to COVID, flu or strep. Denies recent travel. Has negative history of Covid. Has completed Covid vaccines. Has not had Covid booster or flu vaccine. Has not taken OTC medications for this. There are no aggravating or alleviating factors. Denies previous symptoms in the past. Denies fever, chills, fatigue, sinus pain, SOB, wheezing, chest pain, nausea, changes in bowel or bladder habits.    ROS: As per HPI.  All other pertinent ROS negative.     Past Medical History:  Diagnosis Date  . Hypertension    Past Surgical History:  Procedure Laterality Date  . ANKLE SURGERY     fusion   No Known Allergies No current facility-administered medications on file prior to encounter.   Current Outpatient Medications on File Prior to Encounter  Medication Sig Dispense Refill  . gabapentin (NEURONTIN) 100 MG capsule Take 100 mg by mouth 3 (three) times daily.    Marland Kitchen lisinopril (ZESTRIL) 40 MG tablet Take 40 mg by mouth daily. (Patient not taking: Reported on 06/02/2020)    . metoprolol succinate (TOPROL-XL) 25 MG 24 hr tablet Take 25 mg by mouth daily. (Patient not taking: Reported on 06/02/2020)    . naproxen sodium (ALEVE) 220 MG tablet Take 220 mg by mouth.    . oxyCODONE-acetaminophen (PERCOCET/ROXICET) 5-325 MG tablet One tablet every six hours for pain. 100 tablet 0  . rosuvastatin (CRESTOR) 20 MG tablet Take 20 mg by mouth daily.     Social History   Socioeconomic History  . Marital status: Married    Spouse name: Liborio Nixon  . Number of children: 1  . Years of education: Not on file  . Highest education level: Bachelor's degree (e.g., BA, AB, BS)  Occupational History  . Not on file  Tobacco Use  . Smoking status: Former Smoker    Packs/day:  0.50    Years: 25.00    Pack years: 12.50    Types: Cigarettes    Quit date: 08/10/2012    Years since quitting: 8.1  . Smokeless tobacco: Never Used  Substance and Sexual Activity  . Alcohol use: Yes    Alcohol/week: 3.0 standard drinks    Types: 3 Cans of beer per week    Comment: 08/11/19 per day  . Drug use: Never  . Sexual activity: Not on file  Other Topics Concern  . Not on file  Social History Narrative   Lives with wife   Caffeine- coffee 1 cup daily   Social Determinants of Health   Financial Resource Strain: Not on file  Food Insecurity: Not on file  Transportation Needs: Not on file  Physical Activity: Not on file  Stress: Not on file  Social Connections: Not on file  Intimate Partner Violence: Not on file   Family History  Problem Relation Age of Onset  . Multiple sclerosis Mother   . High blood pressure Father   . Heart attack Brother   . High blood pressure Brother     OBJECTIVE:  Vitals:   10/09/20 0946 10/09/20 0949  BP: (!) 184/110   Pulse: 94   Resp: 16   Temp: 98.6 F (37 C)   TempSrc: Oral   SpO2: 99%   Weight:  192 lb (87.1 kg)  Height:  6'  4" (1.93 m)     General appearance: alert; appears fatigued, but nontoxic; speaking in full sentences and tolerating own secretions HEENT: NCAT; Ears: EACs clear, TMs pearly gray; Eyes: PERRL.  EOM grossly intact. Sinuses: nontender; Nose: nares patent with clear rhinorrhea, Throat: oropharynx erythematous, cobblestoning present, tonsils non erythematous or enlarged, uvula midline  Neck: supple without LAD Lungs: unlabored respirations, symmetrical air entry; cough: absent; no respiratory distress; CTABHeart: regular rate and rhythm.  Radial pulses 2+ symmetrical bilaterally Skin: warm and dry Psychological: alert and cooperative; normal mood and affect  LABS:  No results found for this or any previous visit (from the past 24 hour(s)).   ASSESSMENT & PLAN:  1. Viral illness   2. At increased  risk of exposure to COVID-19 virus   3. Rhinorrhea   4. Cough   5. Nonintractable headache, unspecified chronicity pattern, unspecified headache type   6. Essential hypertension     Discussed that he needs to take his blood pressure medications even when he is not feeling well. Continue supportive care at home COVID and flu testing ordered.  It will take between 2-3 days for test results. Someone will contact you regarding abnormal results.  Patient should remain in quarantine until they have received Covid results.  If negative you may resume normal activities (go back to work/school) while practicing hand hygiene, social distance, and mask wearing.  If positive, patient should remain in quarantine for at least 5 days from symptom onset AND greater than 72 hours after symptoms resolution (absence of fever without the use of fever-reducing medication and improvement in respiratory symptoms), whichever is longer Get plenty of rest and push fluids Use OTC zyrtec for nasal congestion, runny nose, and/or sore throat Use OTC flonase for nasal congestion and runny nose Use medications daily for symptom relief Use OTC medications like ibuprofen or tylenol as needed fever or pain Call or go to the ED if you have any new or worsening symptoms such as fever, worsening cough, shortness of breath, chest tightness, chest pain, turning blue, changes in mental status.  Reviewed expectations re: course of current medical issues. Questions answered. Outlined signs and symptoms indicating need for more acute intervention. Patient verbalized understanding. After Visit Summary given.         Moshe Cipro, NP 10/09/20 1007

## 2020-10-09 NOTE — ED Notes (Signed)
Patient now wants to see the provider due to symptoms of cough and sore throat.

## 2020-10-09 NOTE — Discharge Instructions (Signed)
Your COVID and Flu tests are pending.  You should self quarantine until the test results are back.    Take Tylenol or ibuprofen as needed for fever or discomfort.  Rest and keep yourself hydrated.    Follow-up with your primary care provider if your symptoms are not improving.     

## 2020-10-09 NOTE — ED Triage Notes (Signed)
C/o headache, sore throat, cough, runny nose.  States just wants covid test and does not want to see provider

## 2020-10-12 ENCOUNTER — Telehealth: Payer: Self-pay | Admitting: Orthopaedic Surgery

## 2020-10-12 LAB — COVID-19, FLU A+B NAA
Influenza A, NAA: NOT DETECTED
Influenza B, NAA: NOT DETECTED
SARS-CoV-2, NAA: DETECTED — AB

## 2020-10-12 NOTE — Telephone Encounter (Signed)
Patient requests refill on Oxycodone/Actaminophen 5-325 Mgs. Qty 100  Sig:One tablet every six hours for pain.  [facility] states [facility]

## 2020-10-13 MED ORDER — OXYCODONE-ACETAMINOPHEN 5-325 MG PO TABS: ORAL_TABLET | ORAL | 0 refills | Status: DC

## 2020-11-17 ENCOUNTER — Telehealth: Payer: Self-pay | Admitting: Orthopaedic Surgery

## 2020-11-17 MED ORDER — OXYCODONE-ACETAMINOPHEN 5-325 MG PO TABS: ORAL_TABLET | ORAL | 0 refills | Status: DC

## 2020-11-17 NOTE — Telephone Encounter (Signed)
Patient requests refill on Oxycodone/Actaminophen 5-325 Mgs. Qty 100  Sig:One tablet every six hours for pain.  [facility] states [facility] uses [facility] 

## 2020-12-01 ENCOUNTER — Encounter: Payer: Self-pay | Admitting: Orthopaedic Surgery

## 2020-12-01 ENCOUNTER — Ambulatory Visit: Payer: Medicare HMO | Admitting: Orthopaedic Surgery

## 2020-12-01 ENCOUNTER — Other Ambulatory Visit: Payer: Self-pay

## 2020-12-01 VITALS — BP 197/106 | HR 77 | Ht 76.0 in | Wt 195.0 lb

## 2020-12-01 DIAGNOSIS — G8929 Other chronic pain: Secondary | ICD-10-CM

## 2020-12-01 DIAGNOSIS — M5441 Lumbago with sciatica, right side: Secondary | ICD-10-CM

## 2020-12-01 NOTE — Progress Notes (Signed)
Patient John Proctor, male DOB:December 21, 1950, 70 y.o. PNT:[id number]  Chief Complaint  Patient presents with  . Back Pain    Right side  patient reports some better.     HPI  John Proctor is a 70 y.o. male who has chronic pain of the lower back.  He had more pain with the cold weather and snow but with the warmer weather recently he has less pain.  He still has pain and still needs to take pain medicine.  He has no new trauma, no weakness. He is doing his exercises.   Body mass index is 23.74 kg/m.  ROS  Review of Systems  Constitutional: Positive for activity change.  Musculoskeletal: Positive for arthralgias, back pain and gait problem.  All other systems reviewed and are negative.   All other systems reviewed and are negative.  The following is a summary of the past history medically, past history surgically, known current medicines, social history and family history.  This information is gathered electronically by the computer from prior information and documentation.  I review this each visit and have found including this information at this point in the chart is beneficial and informative.    Past Medical History:  Diagnosis Date  . Hypertension     Past Surgical History:  Procedure Laterality Date  . ANKLE SURGERY     fusion    Family History  Problem Relation Age of Onset  . Multiple sclerosis Mother   . High blood pressure Father   . Heart attack Brother   . High blood pressure Brother     Social History Social History   Tobacco Use  . Smoking status: Former Smoker    Packs/day: 0.50    Years: 25.00    Pack years: 12.50    Types: Cigarettes    Quit date: 08/10/2012    Years since quitting: 8.3  . Smokeless tobacco: Never Used  Substance Use Topics  . Alcohol use: Yes    Alcohol/week: 3.0 standard drinks    Types: 3 Cans of beer per week    Comment: 08/11/19 per day  . Drug use: Never    No Known Allergies  Current Outpatient  Medications  Medication Sig Dispense Refill  . gabapentin (NEURONTIN) 100 MG capsule Take 100 mg by mouth 3 (three) times daily.    Marland Kitchen lisinopril (ZESTRIL) 40 MG tablet Take 40 mg by mouth daily.    . naproxen sodium (ALEVE) 220 MG tablet Take 220 mg by mouth.    . oxyCODONE-acetaminophen (PERCOCET/ROXICET) 5-325 MG tablet One tablet every six hours for pain. 100 tablet 0  . rosuvastatin (CRESTOR) 20 MG tablet Take 20 mg by mouth daily.     No current facility-administered medications for this visit.     Physical Exam  Height 6\' 4"  (1.93 m), weight 195 lb (88.5 kg).  Constitutional: overall normal hygiene, normal nutrition, well developed, normal grooming, normal body habitus. Assistive device:none  Musculoskeletal: gait and station Limp none, muscle tone and strength are normal, no tremors or atrophy is present.  .  Neurological: coordination overall normal.  Deep tendon reflex/nerve stretch intact.  Sensation normal.  Cranial nerves II-XII intact.   Skin:   Normal overall no scars, lesions, ulcers or rashes. No psoriasis.  Psychiatric: Alert and oriented x 3.  Recent memory intact, remote memory unclear.  Normal mood and affect. Well groomed.  Good eye contact.  Cardiovascular: overall no swelling, no varicosities, no edema bilaterally, normal temperatures of the legs  and arms, no clubbing, cyanosis and good capillary refill.  Lymphatic: palpation is normal.  Spine/Pelvis examination:  Inspection:  Overall, sacoiliac joint benign and hips nontender; without crepitus or defects.   Thoracic spine inspection: Alignment normal without kyphosis present   Lumbar spine inspection:  Alignment  with normal lumbar lordosis, without scoliosis apparent.   Thoracic spine palpation:  without tenderness of spinal processes   Lumbar spine palpation: without tenderness of lumbar area; without tightness of lumbar muscles    Range of Motion:   Lumbar flexion, forward flexion is normal  without pain or tenderness    Lumbar extension is full without pain or tenderness   Left lateral bend is normal without pain or tenderness   Right lateral bend is normal without pain or tenderness   Straight leg raising is normal  Strength & tone: normal   Stability overall normal stability  All other systems reviewed and are negative   The patient has been educated about the nature of the problem(s) and counseled on treatment options.  The patient appeared to understand what I have discussed and is in agreement with it. Encounter Diagnosis  Name Primary?  . Chronic right-sided low back pain with right-sided sciatica Yes    PLAN Call if any problems.  Precautions discussed.  Continue current medications.   Return to clinic 3 months   Electronically Signed Darreld Mclean, MD 3/3/20228:17 AM

## 2020-12-20 ENCOUNTER — Telehealth: Payer: Self-pay | Admitting: Orthopaedic Surgery

## 2020-12-20 MED ORDER — OXYCODONE-ACETAMINOPHEN 5-325 MG PO TABS: ORAL_TABLET | ORAL | 0 refills | Status: DC

## 2020-12-20 NOTE — Telephone Encounter (Signed)
Patient called request refill on his pain medicine   oxyCODONE-acetaminophen (PERCOCET/ROXICET) 5-325 MG tablet    [facility]:  [facility]

## 2021-01-12 DIAGNOSIS — Z6824 Body mass index (BMI) 24.0-24.9, adult: Secondary | ICD-10-CM | POA: Diagnosis not present

## 2021-01-12 DIAGNOSIS — Z125 Encounter for screening for malignant neoplasm of prostate: Secondary | ICD-10-CM | POA: Diagnosis not present

## 2021-01-12 DIAGNOSIS — M5431 Sciatica, right side: Secondary | ICD-10-CM | POA: Diagnosis not present

## 2021-01-12 DIAGNOSIS — Z1389 Encounter for screening for other disorder: Secondary | ICD-10-CM | POA: Diagnosis not present

## 2021-01-12 DIAGNOSIS — G894 Chronic pain syndrome: Secondary | ICD-10-CM | POA: Diagnosis not present

## 2021-01-12 DIAGNOSIS — I10 Essential (primary) hypertension: Secondary | ICD-10-CM | POA: Diagnosis not present

## 2021-01-12 DIAGNOSIS — Z0001 Encounter for general adult medical examination with abnormal findings: Secondary | ICD-10-CM | POA: Diagnosis not present

## 2021-01-12 DIAGNOSIS — E7849 Other hyperlipidemia: Secondary | ICD-10-CM | POA: Diagnosis not present

## 2021-01-24 ENCOUNTER — Telehealth: Payer: Self-pay | Admitting: Orthopaedic Surgery

## 2021-01-24 MED ORDER — OXYCODONE-ACETAMINOPHEN 5-325 MG PO TABS: ORAL_TABLET | ORAL | 0 refills | Status: DC

## 2021-01-24 NOTE — Telephone Encounter (Signed)
Patient requests refill: °oxyCODONE-acetaminophen (PERCOCET/ROXICET) 5-325 MG tablet 100 tablet  °    [facility] [place] °

## 2021-02-23 ENCOUNTER — Telehealth: Payer: Self-pay | Admitting: Orthopaedic Surgery

## 2021-02-23 MED ORDER — OXYCODONE-ACETAMINOPHEN 5-325 MG PO TABS: ORAL_TABLET | ORAL | 0 refills | Status: DC

## 2021-02-23 NOTE — Telephone Encounter (Signed)
Patient called request refill on pain medicine   oxyCODONE-acetaminophen (PERCOCET/ROXICET) 5-325 MG tablet  [facility]  [facility]

## 2021-03-02 ENCOUNTER — Other Ambulatory Visit: Payer: Self-pay

## 2021-03-02 ENCOUNTER — Encounter: Payer: Self-pay | Admitting: Orthopaedic Surgery

## 2021-03-02 ENCOUNTER — Ambulatory Visit: Payer: Medicare HMO | Admitting: Orthopaedic Surgery

## 2021-03-02 VITALS — BP 161/84 | HR 74 | Ht 76.0 in | Wt 188.5 lb

## 2021-03-02 DIAGNOSIS — M5441 Lumbago with sciatica, right side: Secondary | ICD-10-CM | POA: Diagnosis not present

## 2021-03-02 DIAGNOSIS — G8929 Other chronic pain: Secondary | ICD-10-CM

## 2021-03-02 NOTE — Progress Notes (Signed)
Patient John Proctor, male DOB:26-Jan-1951, 70 y.o. ION:[id number]  Chief Complaint  Patient presents with  . Back Pain    Pain is tolerable at times, but I'm taking medicine more often.    HPI  John Proctor is a 70 y.o. male who has lower back pain that has gotten worse over the last few weeks.  His pain is deep.  He has no new trauma.  He has paresthesias at time.  He is taking his medicine and doing his exercises.  He has no bowel or bladder problems.   Body mass index is 22.94 kg/m.  ROS  Review of Systems  Constitutional: Positive for activity change.  Musculoskeletal: Positive for arthralgias, back pain and gait problem.  All other systems reviewed and are negative.   All other systems reviewed and are negative.  The following is a summary of the past history medically, past history surgically, known current medicines, social history and family history.  This information is gathered electronically by the computer from prior information and documentation.  I review this each visit and have found including this information at this point in the chart is beneficial and informative.    Past Medical History:  Diagnosis Date  . Hypertension     Past Surgical History:  Procedure Laterality Date  . ANKLE SURGERY     fusion    Family History  Problem Relation Age of Onset  . Multiple sclerosis Mother   . High blood pressure Father   . Heart attack Brother   . High blood pressure Brother     Social History Social History   Tobacco Use  . Smoking status: Former Smoker    Packs/day: 0.50    Years: 25.00    Pack years: 12.50    Types: Cigarettes    Quit date: 08/10/2012    Years since quitting: 8.5  . Smokeless tobacco: Never Used  Substance Use Topics  . Alcohol use: Yes    Alcohol/week: 3.0 standard drinks    Types: 3 Cans of beer per week    Comment: 08/11/19 per day  . Drug use: Never    Allergies  Allergen Reactions  . Gabapentin Nausea Only     Makes my head feel funny    Current Outpatient Medications  Medication Sig Dispense Refill  . gabapentin (NEURONTIN) 100 MG capsule Take 100 mg by mouth 3 (three) times daily.    Marland Kitchen lisinopril (ZESTRIL) 40 MG tablet Take 40 mg by mouth daily.    . naproxen sodium (ALEVE) 220 MG tablet Take 220 mg by mouth.    . oxyCODONE-acetaminophen (PERCOCET/ROXICET) 5-325 MG tablet One tablet every six hours for pain. 100 tablet 0  . rosuvastatin (CRESTOR) 20 MG tablet Take 20 mg by mouth daily.     No current facility-administered medications for this visit.     Physical Exam  Blood pressure (!) 161/84, pulse 74, height 6\' 4"  (1.93 m), weight 188 lb 8 oz (85.5 kg).  Constitutional: overall normal hygiene, normal nutrition, well developed, normal grooming, normal body habitus. Assistive device:none  Musculoskeletal: gait and station Limp none, muscle tone and strength are normal, no tremors or atrophy is present.  .  Neurological: coordination overall normal.  Deep tendon reflex/nerve stretch intact.  Sensation normal.  Cranial nerves II-XII intact.   Skin:   Normal overall no scars, lesions, ulcers or rashes. No psoriasis.  Psychiatric: Alert and oriented x 3.  Recent memory intact, remote memory unclear.  Normal mood and  affect. Well groomed.  Good eye contact.  Cardiovascular: overall no swelling, no varicosities, no edema bilaterally, normal temperatures of the legs and arms, no clubbing, cyanosis and good capillary refill.  Spine/Pelvis examination:  Inspection:  Overall, sacoiliac joint benign and hips nontender; without crepitus or defects.   Thoracic spine inspection: Alignment normal without kyphosis present   Lumbar spine inspection:  Alignment  with normal lumbar lordosis, without scoliosis apparent.   Thoracic spine palpation:  without tenderness of spinal processes   Lumbar spine palpation: without tenderness of lumbar area; without tightness of lumbar muscles    Range  of Motion:   Lumbar flexion, forward flexion is normal without pain or tenderness    Lumbar extension is full without pain or tenderness   Left lateral bend is normal without pain or tenderness   Right lateral bend is normal without pain or tenderness   Straight leg raising is normal  Strength & tone: normal   Stability overall normal stability  Lymphatic: palpation is normal.  All other systems reviewed and are negative   The patient has been educated about the nature of the problem(s) and counseled on treatment options.  The patient appeared to understand what I have discussed and is in agreement with it.  Encounter Diagnosis  Name Primary?  . Chronic right-sided low back pain with right-sided sciatica Yes    PLAN Call if any problems.  Precautions discussed.  Continue current medications.   Return to clinic 3 months   Electronically Signed Darreld Mclean, MD 6/2/20228:10 AM

## 2021-03-28 ENCOUNTER — Telehealth: Payer: Self-pay | Admitting: Orthopaedic Surgery

## 2021-03-28 MED ORDER — OXYCODONE-ACETAMINOPHEN 5-325 MG PO TABS: ORAL_TABLET | ORAL | 0 refills | Status: DC

## 2021-03-28 NOTE — Telephone Encounter (Signed)
Patient requests refill on Oxycodone/Acetaminophen 5-325 mgs.  Qty  100  Sig: One tablet every six hours for pain.  [facility] [place]   PATIENT STATES THAT ON HIS LAST VISIT, IT WAS DISCUSSED ABOUT AN INCREASE IN THIS MEDICATION

## 2021-04-25 ENCOUNTER — Telehealth: Payer: Self-pay | Admitting: Orthopaedic Surgery

## 2021-04-25 MED ORDER — OXYCODONE-ACETAMINOPHEN 5-325 MG PO TABS: ORAL_TABLET | ORAL | 0 refills | Status: DC

## 2021-04-25 NOTE — Telephone Encounter (Signed)
Done

## 2021-04-25 NOTE — Telephone Encounter (Signed)
Patient requests refill: °oxyCODONE-acetaminophen (PERCOCET/ROXICET) 5-325 MG tablet 100 tablet  °    [facility] [place] °

## 2021-05-25 ENCOUNTER — Telehealth: Payer: Self-pay | Admitting: Orthopaedic Surgery

## 2021-05-25 NOTE — Telephone Encounter (Signed)
Patient of Dr. Sanjuan Dame requests Oxycodone/Acetaminophen 5-325 mgs.  [facility] uses [facility] [place]

## 2021-05-29 MED ORDER — OXYCODONE-ACETAMINOPHEN 5-325 MG PO TABS: ORAL_TABLET | ORAL | 0 refills | Status: DC

## 2021-06-01 ENCOUNTER — Ambulatory Visit: Payer: Medicare HMO | Admitting: Orthopaedic Surgery

## 2021-06-01 ENCOUNTER — Other Ambulatory Visit: Payer: Self-pay

## 2021-06-01 ENCOUNTER — Encounter: Payer: Self-pay | Admitting: Orthopaedic Surgery

## 2021-06-01 VITALS — BP 147/91 | HR 71 | Ht 76.0 in | Wt 186.2 lb

## 2021-06-01 DIAGNOSIS — M5441 Lumbago with sciatica, right side: Secondary | ICD-10-CM | POA: Diagnosis not present

## 2021-06-01 DIAGNOSIS — G8929 Other chronic pain: Secondary | ICD-10-CM | POA: Diagnosis not present

## 2021-06-01 NOTE — Progress Notes (Signed)
My back still hurts.  He has chronic lower back pain with right sided sciatica.  He is taking Aleve, doing his exercises and taking his pain medicine.  He has no weakness.  He has more pain with increased activity.  He is still working.  He has no new trauma.  Spine/Pelvis examination:  Inspection:  Overall, sacoiliac joint benign and hips nontender; without crepitus or defects.   Thoracic spine inspection: Alignment normal without kyphosis present   Lumbar spine inspection:  Alignment  with normal lumbar lordosis, without scoliosis apparent.   Thoracic spine palpation:  without tenderness of spinal processes   Lumbar spine palpation: without tenderness of lumbar area; without tightness of lumbar muscles    Range of Motion:   Lumbar flexion, forward flexion is normal without pain or tenderness    Lumbar extension is full without pain or tenderness   Left lateral bend is normal without pain or tenderness   Right lateral bend is normal without pain or tenderness   Straight leg raising is normal  Strength & tone: normal   Stability overall normal stability  Encounter Diagnosis  Name Primary?   Chronic right-sided low back pain with right-sided sciatica Yes   Continue the Aleve and exercises.  Return in three months.  Call if any problem.  Precautions discussed.  Electronically Signed Darreld Mclean, MD 9/1/20228:16 AM

## 2021-06-22 ENCOUNTER — Telehealth: Payer: Self-pay | Admitting: Orthopaedic Surgery

## 2021-06-22 MED ORDER — OXYCODONE-ACETAMINOPHEN 5-325 MG PO TABS: ORAL_TABLET | ORAL | 0 refills | Status: DC

## 2021-06-22 NOTE — Telephone Encounter (Signed)
Patient called requesting refill for his  pain medicine.    oxyCODONE-acetaminophen (PERCOCET/ROXICET) 5-325 MG tablet  [facility] [place]

## 2021-07-27 ENCOUNTER — Telehealth: Payer: Self-pay | Admitting: Orthopaedic Surgery

## 2021-07-27 NOTE — Telephone Encounter (Signed)
Patient called for refill of voice via voice message left at 12:16pm while office was closed. I called back after our return for afternoon clinic; discussed office policy of requests needing to be made before noon on Thursdays. Relayed also that Dr Hilda Lias will return to office Tuesday morning, 08/10/21. oxyCODONE-acetaminophen (PERCOCET/ROXICET) 5-325 MG tablet       [facility]

## 2021-07-31 MED ORDER — OXYCODONE-ACETAMINOPHEN 5-325 MG PO TABS: ORAL_TABLET | ORAL | 0 refills | Status: DC

## 2021-08-22 ENCOUNTER — Telehealth: Payer: Self-pay

## 2021-08-22 MED ORDER — OXYCODONE-ACETAMINOPHEN 5-325 MG PO TABS: ORAL_TABLET | ORAL | 0 refills | Status: DC

## 2021-08-22 NOTE — Telephone Encounter (Signed)
Oxycodone-Acetaminophen  5/325 mg  Qty 100 Tablets  [facility] [place]

## 2021-08-31 ENCOUNTER — Ambulatory Visit (INDEPENDENT_AMBULATORY_CARE_PROVIDER_SITE_OTHER): Payer: Medicare HMO | Admitting: Orthopaedic Surgery

## 2021-08-31 ENCOUNTER — Other Ambulatory Visit: Payer: Self-pay

## 2021-08-31 ENCOUNTER — Encounter: Payer: Self-pay | Admitting: Orthopaedic Surgery

## 2021-08-31 VITALS — BP 143/87 | HR 82 | Ht 76.0 in | Wt 183.0 lb

## 2021-08-31 DIAGNOSIS — M5441 Lumbago with sciatica, right side: Secondary | ICD-10-CM

## 2021-08-31 DIAGNOSIS — G8929 Other chronic pain: Secondary | ICD-10-CM

## 2021-08-31 NOTE — Progress Notes (Signed)
My back hurts most of the time.  He has lower back pain with right sided sciatica.  He has good and bad days.  He has more pain after work.  He has no new trauma, no weakness.  He is taking his medicine and doing his exercises.  Spine/Pelvis examination:  Inspection:  Overall, sacoiliac joint benign and hips nontender; without crepitus or defects.   Thoracic spine inspection: Alignment normal without kyphosis present   Lumbar spine inspection:  Alignment  with normal lumbar lordosis, without scoliosis apparent.   Thoracic spine palpation:  without tenderness of spinal processes   Lumbar spine palpation: without tenderness of lumbar area; without tightness of lumbar muscles    Range of Motion:   Lumbar flexion, forward flexion is normal without pain or tenderness    Lumbar extension is full without pain or tenderness   Left lateral bend is normal without pain or tenderness   Right lateral bend is normal without pain or tenderness   Straight leg raising is normal  Strength & tone: normal   Stability overall normal stability Encounter Diagnosis  Name Primary?   Chronic right-sided low back pain with right-sided sciatica Yes   Continue his medicine.  Continue his exercises.  Return in three months.  Call if any problem.  Precautions discussed.  Electronically Signed Darreld Mclean, MD 12/1/20228:20 AM

## 2021-09-21 ENCOUNTER — Telehealth: Payer: Self-pay | Admitting: Orthopaedic Surgery

## 2021-09-21 MED ORDER — OXYCODONE-ACETAMINOPHEN 5-325 MG PO TABS: ORAL_TABLET | ORAL | 0 refills | Status: DC

## 2021-09-21 NOTE — Telephone Encounter (Signed)
Patient requests refill: oxyCODONE-acetaminophen (PERCOCET/ROXICET) 5-325 MG tablet 100 tablet      [facility]

## 2021-10-24 ENCOUNTER — Telehealth: Payer: Self-pay | Admitting: Orthopaedic Surgery

## 2021-10-24 MED ORDER — OXYCODONE-ACETAMINOPHEN 5-325 MG PO TABS: ORAL_TABLET | ORAL | 0 refills | Status: DC

## 2021-10-24 NOTE — Telephone Encounter (Signed)
Patient called for refill:   oxyCODONE-acetaminophen (PERCOCET/ROXICET) 5-325 MG tablet 100 tablet      [facility] [place]

## 2021-11-02 DIAGNOSIS — J069 Acute upper respiratory infection, unspecified: Secondary | ICD-10-CM | POA: Diagnosis not present

## 2021-11-02 DIAGNOSIS — Z20822 Contact with and (suspected) exposure to covid-19: Secondary | ICD-10-CM | POA: Diagnosis not present

## 2021-11-02 DIAGNOSIS — R059 Cough, unspecified: Secondary | ICD-10-CM | POA: Diagnosis not present

## 2021-11-21 ENCOUNTER — Telehealth: Payer: Self-pay | Admitting: Radiology

## 2021-11-21 MED ORDER — OXYCODONE-ACETAMINOPHEN 5-325 MG PO TABS: ORAL_TABLET | ORAL | 0 refills | Status: DC

## 2021-11-21 NOTE — Telephone Encounter (Signed)
Patient called, requests refill oxycodone.    [facility] [place].

## 2021-11-23 ENCOUNTER — Ambulatory Visit: Payer: Medicare HMO

## 2021-11-23 ENCOUNTER — Encounter: Payer: Self-pay | Admitting: Orthopaedic Surgery

## 2021-11-23 ENCOUNTER — Ambulatory Visit: Payer: Medicare HMO | Admitting: Orthopaedic Surgery

## 2021-11-23 ENCOUNTER — Other Ambulatory Visit: Payer: Self-pay

## 2021-11-23 VITALS — BP 142/75 | HR 66 | Ht 76.0 in | Wt 185.0 lb

## 2021-11-23 DIAGNOSIS — M5441 Lumbago with sciatica, right side: Secondary | ICD-10-CM

## 2021-11-23 DIAGNOSIS — G8929 Other chronic pain: Secondary | ICD-10-CM

## 2021-11-23 NOTE — Progress Notes (Signed)
My back is worse.  He has more pain in the lower back with much more right sided paresthesias.  He has no new trauma.  Pain medicine is not helping that much.  He has had epidural injections in the past.  His last MRI was three years ago.  I will get a new MRI.  He has good reflexes on the right and no spasm.  Spine/Pelvis examination:  Inspection:  Overall, sacoiliac joint benign and hips nontender; without crepitus or defects.   Thoracic spine inspection: Alignment normal without kyphosis present   Lumbar spine inspection:  Alignment  with normal lumbar lordosis, without scoliosis apparent.   Thoracic spine palpation:  without tenderness of spinal processes   Lumbar spine palpation: without tenderness of lumbar area; without tightness of lumbar muscles    Range of Motion:   Lumbar flexion, forward flexion is normal without pain or tenderness    Lumbar extension is full without pain or tenderness   Left lateral bend is normal without pain or tenderness   Right lateral bend is normal without pain or tenderness   Straight leg raising is normal  Strength & tone: normal   Stability overall normal stability  X-rays were done of the lumbar spine, reported separately.  Encounter Diagnosis  Name Primary?   Chronic right-sided low back pain with right-sided sciatica Yes   I will get MRI.  Return in two weeks.  Call if any problem.  Precautions discussed.  Electronically Signed Darreld Mclean, MD 2/23/20238:32 AM

## 2021-11-30 ENCOUNTER — Ambulatory Visit: Payer: Medicare HMO | Admitting: Orthopaedic Surgery

## 2021-12-07 ENCOUNTER — Ambulatory Visit: Payer: Medicare HMO | Admitting: Orthopaedic Surgery

## 2021-12-09 ENCOUNTER — Ambulatory Visit
Admission: RE | Admit: 2021-12-09 | Discharge: 2021-12-09 | Disposition: A | Discharge status: Home / Self Care | Payer: Medicare HMO | Source: Ambulatory Visit | Attending: Orthopaedic Surgery | Admitting: Orthopaedic Surgery

## 2021-12-09 ENCOUNTER — Other Ambulatory Visit: Payer: Self-pay

## 2021-12-09 DIAGNOSIS — M545 Low back pain, unspecified: Secondary | ICD-10-CM | POA: Diagnosis not present

## 2021-12-09 DIAGNOSIS — M48061 Spinal stenosis, lumbar region without neurogenic claudication: Secondary | ICD-10-CM | POA: Diagnosis not present

## 2021-12-09 DIAGNOSIS — G8929 Other chronic pain: Secondary | ICD-10-CM

## 2021-12-14 ENCOUNTER — Encounter: Payer: Self-pay | Admitting: Orthopaedic Surgery

## 2021-12-14 ENCOUNTER — Ambulatory Visit (INDEPENDENT_AMBULATORY_CARE_PROVIDER_SITE_OTHER): Payer: Medicare HMO | Admitting: Orthopaedic Surgery

## 2021-12-14 ENCOUNTER — Other Ambulatory Visit: Payer: Self-pay

## 2021-12-14 DIAGNOSIS — G8929 Other chronic pain: Secondary | ICD-10-CM

## 2021-12-14 DIAGNOSIS — M5441 Lumbago with sciatica, right side: Secondary | ICD-10-CM | POA: Diagnosis not present

## 2021-12-14 NOTE — Addendum Note (Signed)
Addended by: Brand Males E on: 12/14/2021 09:11 AM ? ? Modules accepted: Orders ? ?

## 2021-12-14 NOTE — Progress Notes (Signed)
My back is hurting more ? ?He had the MRI of the lumbar spine showing: ?IMPRESSION: ?1. At L4-5 there is a mild broad-based disc bulge. Severe bilateral ?facet arthropathy with ligamentum flavum infolding and a right facet ?effusion and reactive marrow edema on either side of the right facet ?joint. Bilateral lateral recess stenosis. Mild bilateral foraminal ?stenosis. ?2. At L5-S1 there is severe bilateral facet arthropathy. ?Mild-moderate bilateral foraminal stenosis. ?3. At L3-4 there is a mild broad-based disc bulge. Moderate ?bilateral facet arthropathy. Bilateral subarticular recess stenosis. ?Mild spinal stenosis. ? ?I have explained the findings to him.  I have recommended he see the neurosurgeon again.  He saw them a few years ago.  Surgery was mentioned but he did not want to have any then. ? ?He has pain most of the time now.  I have given him pain medicine which helps but he cannot do many activities around the house like lawn care that he used to be able to do.  He works and wants to work another few years.  He is concerned he may not be able to do this. ? ?I have independently reviewed the MRI.   ? ?He has good motion of the lumbar spine today, more pain on right side but no spasm.  NV intact. ? ?Encounter Diagnosis  ?Name Primary?  ? Chronic right-sided low back pain with right-sided sciatica Yes  ? ?To neurosurgery., ? ?I will see in six weeks. Continue medicines. ? ?Call if any problem. ? ?Precautions discussed. ? ?Electronically Signed ?Sanjuana Kava, MD ?3/16/20238:22 AM ? ?

## 2021-12-19 ENCOUNTER — Telehealth: Payer: Self-pay

## 2021-12-19 NOTE — Telephone Encounter (Signed)
Oxycodone-Acetaminophen 5/325 MG  Qty 100 Tablets ? ?[facility] [place] ?

## 2021-12-21 MED ORDER — OXYCODONE-ACETAMINOPHEN 5-325 MG PO TABS: ORAL_TABLET | ORAL | 0 refills | Status: DC

## 2022-01-02 DIAGNOSIS — M4310 Spondylolisthesis, site unspecified: Secondary | ICD-10-CM | POA: Diagnosis not present

## 2022-01-02 DIAGNOSIS — I10 Essential (primary) hypertension: Secondary | ICD-10-CM | POA: Diagnosis not present

## 2022-01-15 DIAGNOSIS — E7849 Other hyperlipidemia: Secondary | ICD-10-CM | POA: Diagnosis not present

## 2022-01-15 DIAGNOSIS — R5383 Other fatigue: Secondary | ICD-10-CM | POA: Diagnosis not present

## 2022-01-15 DIAGNOSIS — Z6823 Body mass index (BMI) 23.0-23.9, adult: Secondary | ICD-10-CM | POA: Diagnosis not present

## 2022-01-15 DIAGNOSIS — E559 Vitamin D deficiency, unspecified: Secondary | ICD-10-CM | POA: Diagnosis not present

## 2022-01-15 DIAGNOSIS — Z1331 Encounter for screening for depression: Secondary | ICD-10-CM | POA: Diagnosis not present

## 2022-01-15 DIAGNOSIS — Z125 Encounter for screening for malignant neoplasm of prostate: Secondary | ICD-10-CM | POA: Diagnosis not present

## 2022-01-15 DIAGNOSIS — G894 Chronic pain syndrome: Secondary | ICD-10-CM | POA: Diagnosis not present

## 2022-01-15 DIAGNOSIS — I10 Essential (primary) hypertension: Secondary | ICD-10-CM | POA: Diagnosis not present

## 2022-01-15 DIAGNOSIS — E782 Mixed hyperlipidemia: Secondary | ICD-10-CM | POA: Diagnosis not present

## 2022-01-15 DIAGNOSIS — R400 Somnolence: Secondary | ICD-10-CM | POA: Diagnosis not present

## 2022-01-15 DIAGNOSIS — Z0001 Encounter for general adult medical examination with abnormal findings: Secondary | ICD-10-CM | POA: Diagnosis not present

## 2022-01-15 DIAGNOSIS — R42 Dizziness and giddiness: Secondary | ICD-10-CM | POA: Diagnosis not present

## 2022-01-16 ENCOUNTER — Telehealth: Payer: Self-pay | Admitting: Radiology

## 2022-01-16 MED ORDER — OXYCODONE-ACETAMINOPHEN 5-325 MG PO TABS: ORAL_TABLET | ORAL | 0 refills | Status: DC

## 2022-01-16 NOTE — Telephone Encounter (Signed)
Patient called, requesting refill meds.   ?

## 2022-01-25 ENCOUNTER — Encounter: Payer: Self-pay | Admitting: Orthopaedic Surgery

## 2022-01-25 ENCOUNTER — Ambulatory Visit: Payer: Medicare HMO | Admitting: Orthopaedic Surgery

## 2022-01-25 VITALS — BP 156/88 | HR 69

## 2022-01-25 DIAGNOSIS — M5441 Lumbago with sciatica, right side: Secondary | ICD-10-CM | POA: Diagnosis not present

## 2022-01-25 DIAGNOSIS — G8929 Other chronic pain: Secondary | ICD-10-CM | POA: Diagnosis not present

## 2022-01-25 NOTE — Progress Notes (Signed)
My back hurts more some days. ? ?He was seen by neurosurgeon.  I have read the notes of the 01-02-22 visit with Dr. Jordan Likes.  Surgery was given as an option for the lower back pain but they decided to maintain his present pain management and activity. ? ?He has good and bad days.  He has much less pain this morning.  He has no new trauma. ? ?I will continue his pain medicine as needed. ? ?Spine/Pelvis examination: ? Inspection:  Overall, sacoiliac joint benign and hips nontender; without crepitus or defects. ? ? Thoracic spine inspection: Alignment normal without kyphosis present ? ? Lumbar spine inspection:  Alignment  with normal lumbar lordosis, without scoliosis apparent. ? ? Thoracic spine palpation:  without tenderness of spinal processes ? ? Lumbar spine palpation: without tenderness of lumbar area; without tightness of lumbar muscles  ? ? Range of Motion: ?  Lumbar flexion, forward flexion is normal without pain or tenderness  ?  Lumbar extension is full without pain or tenderness ?  Left lateral bend is normal without pain or tenderness ?  Right lateral bend is normal without pain or tenderness ?  Straight leg raising is normal ? Strength & tone: normal ? ? Stability overall normal stability ? ?Encounter Diagnosis  ?Name Primary?  ? Chronic right-sided low back pain with right-sided sciatica Yes  ? ?Return in six weeks. ? ?Call if any problem. ? ?Precautions discussed. ? ?Electronically Signed ?Darreld Mclean, MD ?4/27/20238:27 AM ? ?

## 2022-02-06 ENCOUNTER — Ambulatory Visit: Payer: Medicare HMO | Admitting: Orthopaedic Surgery

## 2022-02-12 ENCOUNTER — Other Ambulatory Visit: Payer: Self-pay | Admitting: Orthopaedic Surgery

## 2022-02-12 MED ORDER — OXYCODONE-ACETAMINOPHEN 5-325 MG PO TABS: 1.0000 | ORAL_TABLET | Freq: Four times a day (QID) | ORAL | 0 refills | Status: DC | PRN

## 2022-02-12 NOTE — Telephone Encounter (Signed)
Patient called for refill; aware while Dr Hilda Lias is out of clinic until week 6.5.23, that one of our other providers will review refill requests:  ?oxyCODONE-acetaminophen (PERCOCET/ROXICET) 5-325 MG tablet 100 tablet  ?            [facility] [place] ?

## 2022-02-16 ENCOUNTER — Other Ambulatory Visit: Payer: Self-pay

## 2022-02-16 NOTE — Telephone Encounter (Signed)
Oxycodone-Acetaminophen 5/325 MG  Qty 28 Tablets  Take 1 tablet by mouth every 6 (six) hours as needed for up to 7 days as needed for severe pain.  [facility] USES [place] [facility]

## 2022-02-19 ENCOUNTER — Telehealth: Payer: Self-pay | Admitting: Orthopaedic Surgery

## 2022-02-19 NOTE — Telephone Encounter (Signed)
It is the request that was entered on 02/16/22. Note is closed. [facility] said [facility] [place] [facility] not [facility] refill request.

## 2022-02-19 NOTE — Telephone Encounter (Signed)
Patient called back relaying that [facility] [place] does not have his prescription / Dr Hilda Lias patient, aware that in his absence from clinic until 03/06/22, our other providers are reviewing refill requests - :  oxyCODONE-acetaminophen (PERCOCET/ROXICET) 5-325 MG tablet

## 2022-02-20 ENCOUNTER — Other Ambulatory Visit: Payer: Self-pay

## 2022-02-20 ENCOUNTER — Other Ambulatory Visit: Payer: Self-pay | Admitting: Orthopedic Surgery

## 2022-02-20 DIAGNOSIS — G8929 Other chronic pain: Secondary | ICD-10-CM

## 2022-02-20 MED ORDER — OXYCODONE-ACETAMINOPHEN 5-325 MG PO TABS: 1.0000 | ORAL_TABLET | Freq: Three times a day (TID) | ORAL | 0 refills | Status: AC | PRN

## 2022-02-20 NOTE — Telephone Encounter (Signed)
Refer to pain management   He was offered surgery by neuro surgery he refused   1 rx last one see neurosurgery or go to pain mngment

## 2022-02-20 NOTE — Telephone Encounter (Signed)
Spoke with patient and advised him that this refill would be the last one and a referral had been put in for pain management. Advised patient that he has refused surgery by neuro. Patient stated that neuro said if what he was doing was working to continue that and when it no longer worked they would discuss surgery. I advised patient that Dr.Keeling is out of the office and Dr. Romeo Apple would refill it this time but not again.Patient stated "I'll just talk to Dr.Keeling when he gets back because this is bullshit".

## 2022-02-20 NOTE — Progress Notes (Signed)
Meds ordered this encounter  Medications   oxyCODONE-acetaminophen (PERCOCET/ROXICET) 5-325 MG tablet    Sig: Take 1 tablet by mouth every 8 (eight) hours as needed for up to 7 days for severe pain. One tablet every six hours for pain.    Dispense:  21 tablet    Refill:  0    Refer to neurosurgery for pain mngement   And refer to pain mngment

## 2022-02-27 DIAGNOSIS — I10 Essential (primary) hypertension: Secondary | ICD-10-CM | POA: Diagnosis not present

## 2022-02-27 DIAGNOSIS — G894 Chronic pain syndrome: Secondary | ICD-10-CM | POA: Diagnosis not present

## 2022-03-09 ENCOUNTER — Telehealth: Payer: Self-pay | Admitting: Radiology

## 2022-03-09 NOTE — Telephone Encounter (Signed)
You referred patient to [facility] for pain management, the referral was denied, patient already has a pain management doctor

## 2022-03-15 ENCOUNTER — Encounter: Payer: Self-pay | Admitting: Orthopaedic Surgery

## 2022-03-15 ENCOUNTER — Ambulatory Visit: Payer: Medicare HMO | Admitting: Orthopaedic Surgery

## 2022-03-15 DIAGNOSIS — G8929 Other chronic pain: Secondary | ICD-10-CM | POA: Diagnosis not present

## 2022-03-15 DIAGNOSIS — M5441 Lumbago with sciatica, right side: Secondary | ICD-10-CM

## 2022-03-15 NOTE — Progress Notes (Signed)
My back still hurts  He has lower back pain that is worse at the end of his shift.  He has no numbness, no new trauma, no weakness.  He had a problem getting pain medicine from the office in my absence.  He saw Dr. Sherwood Gambler who gave him pain medicine.  Spine/Pelvis examination:  Inspection:  Overall, sacoiliac joint benign and hips nontender; without crepitus or defects.   Thoracic spine inspection: Alignment normal without kyphosis present   Lumbar spine inspection:  Alignment  with normal lumbar lordosis, without scoliosis apparent.   Thoracic spine palpation:  without tenderness of spinal processes   Lumbar spine palpation: without tenderness of lumbar area; without tightness of lumbar muscles    Range of Motion:   Lumbar flexion, forward flexion is normal without pain or tenderness    Lumbar extension is full without pain or tenderness   Left lateral bend is normal without pain or tenderness   Right lateral bend is normal without pain or tenderness   Straight leg raising is normal  Strength & tone: normal   Stability overall normal stability  Encounter Diagnosis  Name Primary?   Chronic right-sided low back pain with right-sided sciatica Yes   Return in three months.  I can resume pain medicines when due but I will be out of town for several weeks.  He may need to talk to Dr. Sherwood Gambler again.  Call if any problem.  Precautions discussed.  Electronically Signed Darreld Mclean, MD 6/15/20238:11 AM

## 2022-03-20 ENCOUNTER — Telehealth: Payer: Self-pay

## 2022-03-20 NOTE — Telephone Encounter (Signed)
Oxycodone-Acetaminophen 5/325 MG  [facility] [place]    [facility] told [facility] would [facility] prescription if you will post date it for 03/30/22.

## 2022-03-21 MED ORDER — OXYCODONE-ACETAMINOPHEN 5-325 MG PO TABS: 1.0000 | ORAL_TABLET | Freq: Four times a day (QID) | ORAL | 0 refills | Status: DC | PRN

## 2022-04-26 ENCOUNTER — Telehealth: Payer: Self-pay

## 2022-04-26 NOTE — Telephone Encounter (Signed)
Oxycodone-Acetaminophen 5/325 MG  Qty 120 Tablets  [facility] [place]

## 2022-04-29 MED ORDER — OXYCODONE-ACETAMINOPHEN 5-325 MG PO TABS: 1.0000 | ORAL_TABLET | Freq: Four times a day (QID) | ORAL | 0 refills | Status: DC | PRN

## 2022-05-17 DIAGNOSIS — K859 Acute pancreatitis without necrosis or infection, unspecified: Secondary | ICD-10-CM | POA: Diagnosis not present

## 2022-05-17 DIAGNOSIS — R531 Weakness: Secondary | ICD-10-CM | POA: Diagnosis not present

## 2022-05-17 DIAGNOSIS — R079 Chest pain, unspecified: Secondary | ICD-10-CM | POA: Diagnosis not present

## 2022-05-17 DIAGNOSIS — R0602 Shortness of breath: Secondary | ICD-10-CM | POA: Diagnosis not present

## 2022-05-17 DIAGNOSIS — R61 Generalized hyperhidrosis: Secondary | ICD-10-CM | POA: Diagnosis not present

## 2022-05-17 DIAGNOSIS — R5383 Other fatigue: Secondary | ICD-10-CM | POA: Diagnosis not present

## 2022-05-17 DIAGNOSIS — E785 Hyperlipidemia, unspecified: Secondary | ICD-10-CM | POA: Diagnosis not present

## 2022-05-17 DIAGNOSIS — I10 Essential (primary) hypertension: Secondary | ICD-10-CM | POA: Diagnosis not present

## 2022-05-17 DIAGNOSIS — R5381 Other malaise: Secondary | ICD-10-CM | POA: Diagnosis not present

## 2022-05-17 DIAGNOSIS — R0789 Other chest pain: Secondary | ICD-10-CM | POA: Diagnosis not present

## 2022-05-24 ENCOUNTER — Other Ambulatory Visit: Payer: Self-pay | Admitting: Orthopaedic Surgery

## 2022-05-24 MED ORDER — OXYCODONE-ACETAMINOPHEN 5-325 MG PO TABS: 1.0000 | ORAL_TABLET | Freq: Four times a day (QID) | ORAL | 0 refills | Status: AC | PRN

## 2022-05-24 NOTE — Telephone Encounter (Signed)
Patient called for refill:oxyCODONE-acetaminophen (PERCOCET/ROXICET) 5-325 MG tablet [facility] [place]   - relayed to patient that while Dr Hilda Lias is out of clinic this week, requests are being reviewed by our other providers

## 2022-06-05 ENCOUNTER — Telehealth: Payer: Self-pay | Admitting: Orthopaedic Surgery

## 2022-06-05 MED ORDER — OXYCODONE-ACETAMINOPHEN 5-325 MG PO TABS: 1.0000 | ORAL_TABLET | Freq: Four times a day (QID) | ORAL | 0 refills | Status: DC | PRN

## 2022-06-05 NOTE — Telephone Encounter (Signed)
Patient wants a refill on his pain medicine. Patient states Dr. Romeo Apple filled it last week on  05/24/22 and he has run out of it.   oxyCODONE-acetaminophen (PERCOCET/ROXICET) 5-325 MG tablet  Pharmacy:  [facility]

## 2022-06-19 ENCOUNTER — Ambulatory Visit: Payer: Medicare HMO | Admitting: Orthopaedic Surgery

## 2022-06-19 ENCOUNTER — Encounter: Payer: Self-pay | Admitting: Orthopaedic Surgery

## 2022-06-19 DIAGNOSIS — G8929 Other chronic pain: Secondary | ICD-10-CM

## 2022-06-19 DIAGNOSIS — R5383 Other fatigue: Secondary | ICD-10-CM | POA: Diagnosis not present

## 2022-06-19 DIAGNOSIS — Z6822 Body mass index (BMI) 22.0-22.9, adult: Secondary | ICD-10-CM | POA: Diagnosis not present

## 2022-06-19 DIAGNOSIS — M5441 Lumbago with sciatica, right side: Secondary | ICD-10-CM

## 2022-06-19 DIAGNOSIS — I10 Essential (primary) hypertension: Secondary | ICD-10-CM | POA: Diagnosis not present

## 2022-06-19 DIAGNOSIS — R0789 Other chest pain: Secondary | ICD-10-CM | POA: Diagnosis not present

## 2022-06-19 DIAGNOSIS — E782 Mixed hyperlipidemia: Secondary | ICD-10-CM | POA: Diagnosis not present

## 2022-06-19 DIAGNOSIS — R1013 Epigastric pain: Secondary | ICD-10-CM | POA: Diagnosis not present

## 2022-06-19 NOTE — Progress Notes (Signed)
My back is hurting more.  He has more paresthesias and pain down the right leg to the foot.  He has delayed considering any surgery but now is reconsidering.  He has appointment to see neurosurgeon next month and is seriously considering having the procedure done.  He is taking his medicine.  His lower back has good ROM but has positive SLR on right at 25 but no atrophy.  NV intact.  Encounter Diagnosis  Name Primary?   Chronic right-sided low back pain with right-sided sciatica Yes   Return in three months.  Call if any problem.  Precautions discussed.  Electronically Signed Sanjuana Kava, MD 9/19/20238:18 AM

## 2022-06-26 DIAGNOSIS — R748 Abnormal levels of other serum enzymes: Secondary | ICD-10-CM | POA: Diagnosis not present

## 2022-07-03 ENCOUNTER — Telehealth: Payer: Self-pay | Admitting: Orthopaedic Surgery

## 2022-07-03 NOTE — Telephone Encounter (Signed)
Done

## 2022-07-03 NOTE — Telephone Encounter (Signed)
Patient called (message left) to request refill: oxyCODONE-acetaminophen (PERCOCET/ROXICET) 5-325 MG tablet 120 tablet    [facility] [employer]

## 2022-07-04 ENCOUNTER — Other Ambulatory Visit: Payer: Self-pay | Admitting: Neurosurgery

## 2022-07-04 DIAGNOSIS — M4310 Spondylolisthesis, site unspecified: Secondary | ICD-10-CM | POA: Diagnosis not present

## 2022-07-04 MED ORDER — OXYCODONE-ACETAMINOPHEN 5-325 MG PO TABS: 1.0000 | ORAL_TABLET | Freq: Four times a day (QID) | ORAL | 0 refills | Status: AC | PRN

## 2022-07-25 ENCOUNTER — Other Ambulatory Visit: Payer: Self-pay

## 2022-07-25 ENCOUNTER — Ambulatory Visit: Payer: Medicare HMO | Attending: Student

## 2022-07-25 DIAGNOSIS — M6281 Muscle weakness (generalized): Secondary | ICD-10-CM | POA: Insufficient documentation

## 2022-07-25 DIAGNOSIS — R2681 Unsteadiness on feet: Secondary | ICD-10-CM | POA: Diagnosis not present

## 2022-07-25 DIAGNOSIS — M5416 Radiculopathy, lumbar region: Secondary | ICD-10-CM | POA: Diagnosis not present

## 2022-07-25 NOTE — Therapy (Signed)
OUTPATIENT PHYSICAL THERAPY THORACOLUMBAR EVALUATION   Patient Name: John Proctor MRN: [id number] DOB:07-15-1951, 71 y.o., male Today's Date: 07/25/2022   PT End of Session - 07/25/22 1118     Visit Number 1    Number of Visits 4    Date for PT Re-Evaluation 08/10/22    PT Start Time 1119    PT Stop Time 1200    PT Time Calculation (min) 41 min    Activity Tolerance Patient tolerated treatment well    Behavior During Therapy WFL for tasks assessed/performed             Past Medical History:  Diagnosis Date   Hypertension    Past Surgical History:  Procedure Laterality Date   ANKLE SURGERY     fusion   There are no problems to display for this patient.   PCP: Redmond School, MD  REFERRING PROVIDER: Patricia Nettle, NP  REFERRING DIAG: Spondylolisthesis, site unspecified  Rationale for Evaluation and Treatment Rehabilitation  THERAPY DIAG:  No diagnosis found.  ONSET DATE: 3 years ago   SUBJECTIVE:                                                                                                                                                                                           SUBJECTIVE STATEMENT: Patient reports that his back has been bothering him for over 3 years now. He had tried three injections, but these didn't really help. He is scheduled to have have surgery on 08/13/22. However, he was told that he needed to try 2-3 visits of therapy prior having surgery due to his insurance. He notes that he has been having pain radiating down his his right leg into the foot. He has also noticed that he is getting pain radiating down his right. He also has numbness below his knees in both legs. This began in his right leg, but has recently begun in his left leg.   PERTINENT HISTORY:  HTN, scheduled for lumbar surgery on 08/13/22  PAIN:  Are you having pain? Yes: NPRS scale: 9-10/10 Pain location: low back radiating down both legs Pain description:  numbness, sharp Aggravating factors: movement Relieving factors: medication   PRECAUTIONS: Fall  WEIGHT BEARING RESTRICTIONS: No  FALLS:  Has patient fallen in last 6 months? Yes. 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 At L4-5 there is a mild broad-based disc bulge. Severe bilateral facet arthropathy with ligamentum flavum infolding and a right facet effusion and reactive marrow edema on either side of the right facet joint. Bilateral lateral recess stenosis. Mild bilateral foraminal stenosis. 2. At L5-S1 there is severe bilateral facet arthropathy. Mild-moderate bilateral foraminal stenosis. 3. At L3-4 there is a mild broad-based disc bulge. Moderate bilateral facet arthropathy. Bilateral subarticular recess stenosis. Mild spinal stenosis.  SCREENING FOR RED FLAGS: Bowel or bladder incontinence: No Spinal tumors: No Cauda equina syndrome: No Compression fracture: No Abdominal aneurysm: No  COGNITION: Overall cognitive status: Within functional limits for tasks assessed     SENSATION: Patient reports numbness in both legs below his knees. Diminished sensation to light touch bilaterally below his knees and absent sensation in both feet and ankles  POSTURE: rounded shoulders and forward head  PALPATION: TTP: bilateral lumbar paraspinals, and piriformis  JOINT MOBILITY: Lumbar spine: hypomobile and painful   LUMBAR ROM:   AROM eval  Flexion 34  Extension 12  Right lateral flexion 75% limited   Left lateral flexion 75% limited  Right rotation 50% limited; reproduced  familiar pain   Left rotation 50% limited   (Blank rows = not tested)  LOWER EXTREMITY ROM:  WFL for activities assessed   LOWER EXTREMITY MMT:    MMT Right eval Left eval  Hip flexion 3+/5 4-/5  Hip extension    Hip abduction    Hip adduction    Hip internal rotation    Hip external rotation    Knee flexion 3/5 3/5  Knee extension 3/5 3+/5  Ankle dorsiflexion 3/5 3+/5  Ankle plantarflexion    Ankle inversion    Ankle eversion     (Blank rows = not tested)  FUNCTIONAL TESTS:  Required upper extremity support for sit to stand transfers  GAIT: Assistive device utilized: None Level of assistance: Complete Independence Comments: wide BOS, difficulty walking in a straight line due to instability  TODAY'S TREATMENT:                                                                                                                              DATE:     PATIENT EDUCATION:  Education details: POC, surgery, MRI results Person educated: Patient Education method: Explanation Education comprehension: verbalized understanding  HOME EXERCISE PROGRAM:   ASSESSMENT:  CLINICAL IMPRESSION: Patient is a 71 y.o. male who was seen today for physical therapy evaluation and treatment for chronic low back and lower extremity pain. He presented to treatment with moderate to high pain severity and irritability with right lumbar rotation being the most aggravating to his familiar symptoms. He also exhibited diminished sensation to light touch and reduced muscular strength bilaterally. He has a poor rehabilitation potential due to chronicity, severity, and irritability of his familiar symptoms in addition to his  impaired sensation.   OBJECTIVE IMPAIRMENTS: Abnormal gait, decreased activity tolerance, decreased mobility, difficulty walking, decreased ROM, decreased strength, hypomobility, impaired sensation, postural dysfunction, and pain.   ACTIVITY LIMITATIONS: carrying, lifting, bending,  standing, stairs, and locomotion level  PARTICIPATION LIMITATIONS: shopping, community activity, and occupation  PERSONAL FACTORS: Time since onset of injury/illness/exacerbation and 1 comorbidity: HTN  are also affecting patient's functional outcome.   REHAB POTENTIAL: Poor    CLINICAL DECISION MAKING: Evolving/moderate complexity  EVALUATION COMPLEXITY: Moderate   GOALS: Goals reviewed with patient? Yes  LONG TERM GOALS: Target date: 08/08/2022  Patient will be independent with his HEP.  Baseline:  Goal status: INITIAL  2.  Patient will be able to complete his daily activities without his familiar pain exceeding 7/10. Baseline:  Goal status: INITIAL  3.  Patient will be able to safely ambulate at least 80 feet without gait deviations or instability.  Baseline:  Goal status: INITIAL  PLAN:  PT FREQUENCY: 2-3x/week  PT DURATION:  4 sessions  PLANNED INTERVENTIONS: Therapeutic exercises, Therapeutic activity, Neuromuscular re-education, Balance training, Gait training, Patient/Family education, Self Care, Joint mobilization, Stair training, Electrical stimulation, Spinal mobilization, Cryotherapy, Moist heat, Manual therapy, and Re-evaluation.  PLAN FOR NEXT SESSION: nustep, light lower extremity strengthening, and AROM    Darlin Coco, PT 07/25/2022, 3:45 PM

## 2022-07-27 ENCOUNTER — Ambulatory Visit: Payer: Medicare HMO

## 2022-07-27 DIAGNOSIS — M6281 Muscle weakness (generalized): Secondary | ICD-10-CM | POA: Diagnosis not present

## 2022-07-27 DIAGNOSIS — R2681 Unsteadiness on feet: Secondary | ICD-10-CM | POA: Diagnosis not present

## 2022-07-27 DIAGNOSIS — M5416 Radiculopathy, lumbar region: Secondary | ICD-10-CM | POA: Diagnosis not present

## 2022-07-27 NOTE — Therapy (Signed)
OUTPATIENT PHYSICAL THERAPY THORACOLUMBAR EVALUATION   Patient Name: John Proctor MRN: [id number] DOB:August 25, 1951, 71 y.o., male Today's Date: 07/27/2022   PT End of Session - 07/27/22 0818     Visit Number 2    Number of Visits 4    Date for PT Re-Evaluation 08/10/22    PT Start Time 0815    PT Stop Time 0856    PT Time Calculation (min) 41 min    Activity Tolerance Patient tolerated treatment well    Behavior During Therapy [facility] for tasks assessed/performed             Past Medical History:  Diagnosis Date   Hypertension    Past Surgical History:  Procedure Laterality Date   ANKLE SURGERY     fusion   There are no problems to display for this patient.   PCP: Elfredia Nevins, MD  REFERRING PROVIDER: Floreen Comber, NP  REFERRING DIAG: Spondylolisthesis, site unspecified  Rationale for Evaluation and Treatment Rehabilitation  THERAPY DIAG:  Radiculopathy, lumbar region  Muscle weakness (generalized)  Unsteadiness on feet  ONSET DATE: 3 years ago   SUBJECTIVE:                                                                                                                                                                                           SUBJECTIVE STATEMENT: Pt reports 5/10 low back and right hip pain today   PERTINENT HISTORY:  HTN, scheduled for lumbar surgery on 08/13/22  PAIN:  Are you having pain? Yes: NPRS scale: 5/10 Pain location: low back radiating down both legs Pain description: numbness, sharp Aggravating factors: movement Relieving factors: medication   PRECAUTIONS: Fall  WEIGHT BEARING RESTRICTIONS: No  FALLS:  Has patient fallen in last 6 months? Yes. Number of falls 1; he tripped causing him to fall at work   PATIENT GOALS: reduced pain, have surgery   OBJECTIVE:   DIAGNOSTIC FINDINGS: 12/09/21 Lumbar MRI IMPRESSION: 1. At L4-5 there is a mild broad-based disc bulge. Severe bilateral facet arthropathy with  ligamentum flavum infolding and a right facet effusion and reactive marrow edema on either side of the right facet joint. Bilateral lateral recess stenosis. Mild bilateral foraminal stenosis. 2. At L5-S1 there is severe bilateral facet arthropathy. Mild-moderate bilateral foraminal stenosis. 3. At L3-4 there is a mild broad-based disc bulge. Moderate bilateral facet arthropathy. Bilateral subarticular recess stenosis. Mild spinal stenosis.  SCREENING FOR RED FLAGS: Bowel or bladder incontinence: No Spinal tumors: No Cauda equina syndrome: No Compression fracture: No Abdominal aneurysm: No  COGNITION: Overall cognitive status: Within functional limits for tasks assessed  SENSATION: Patient reports numbness in both legs below his knees. Diminished sensation to light touch bilaterally below his knees and absent sensation in both feet and ankles  POSTURE: rounded shoulders and forward head  PALPATION: TTP: bilateral lumbar paraspinals, and piriformis  JOINT MOBILITY: Lumbar spine: hypomobile and painful   LUMBAR ROM:   AROM eval  Flexion 34  Extension 12  Right lateral flexion 75% limited   Left lateral flexion 75% limited  Right rotation 50% limited; reproduced familiar pain   Left rotation 50% limited   (Blank rows = not tested)  LOWER EXTREMITY ROM:  WFL for activities assessed   LOWER EXTREMITY MMT:    MMT Right eval Left eval  Hip flexion 3+/5 4-/5  Hip extension    Hip abduction    Hip adduction    Hip internal rotation    Hip external rotation    Knee flexion 3/5 3/5  Knee extension 3/5 3+/5  Ankle dorsiflexion 3/5 3+/5  Ankle plantarflexion    Ankle inversion    Ankle eversion     (Blank rows = not tested)  FUNCTIONAL TESTS:  Required upper extremity support for sit to stand transfers  GAIT: Assistive device utilized: None Level of assistance: Complete Independence Comments: wide BOS, difficulty walking in a straight line due to  instability  TODAY'S TREATMENT:                                                                                                                              DATE:                                      EXERCISE LOG  Exercise Repetitions and Resistance Comments  Nustep  Lvl 3 x 10 mins   Heel/Toe Raises 20 reps each    Hip Flexion 20 reps bil   Hip Abduction 20 reps bil   Hip Extension 20 reps bil   Rockerboard x 3 mins   LAQ 2# x 20 bil   Seated Marches 2# x 20 bil   Ball Squeezes x 3 mins   Seated Hip Abduction Red t-band x 3 mins   Ham Curls Red t-band x 20 reps bi    Blank cell = exercise not performed today   PATIENT EDUCATION:  Education details: POC, surgery, MRI results Person educated: Patient Education method: Explanation Education comprehension: verbalized understanding  HOME EXERCISE PROGRAM:   ASSESSMENT:  CLINICAL IMPRESSION: Pt arrives for today's treatment session reporting 5/10 low back and right hip pain.  Pt able to tolerate Nustep for warm-up today x 10 mins prior to increase in BLE pain.  Pt instructed in standing and seated BLE strengthening exercises to tolerance.  Pt requiring min cues for proper technique and posture with all new exercises.  Pt denied increase in pain at completion of today's treatment session.  OBJECTIVE IMPAIRMENTS: Abnormal  gait, decreased activity tolerance, decreased mobility, difficulty walking, decreased ROM, decreased strength, hypomobility, impaired sensation, postural dysfunction, and pain.   ACTIVITY LIMITATIONS: carrying, lifting, bending, standing, stairs, and locomotion level  PARTICIPATION LIMITATIONS: shopping, community activity, and occupation  PERSONAL FACTORS: Time since onset of injury/illness/exacerbation and 1 comorbidity: HTN  are also affecting patient's functional outcome.   REHAB POTENTIAL: Poor    CLINICAL DECISION MAKING: Evolving/moderate complexity  EVALUATION COMPLEXITY: Moderate   GOALS: Goals  reviewed with patient? Yes  LONG TERM GOALS: Target date: 08/08/2022  Patient will be independent with his HEP.  Baseline:  Goal status: INITIAL  2.  Patient will be able to complete his daily activities without his familiar pain exceeding 7/10. Baseline:  Goal status: INITIAL  3.  Patient will be able to safely ambulate at least 80 feet without gait deviations or instability.  Baseline:  Goal status: INITIAL  PLAN:  PT FREQUENCY: 2-3x/week  PT DURATION:  4 sessions  PLANNED INTERVENTIONS: Therapeutic exercises, Therapeutic activity, Neuromuscular re-education, Balance training, Gait training, Patient/Family education, Self Care, Joint mobilization, Stair training, Electrical stimulation, Spinal mobilization, Cryotherapy, Moist heat, Manual therapy, and Re-evaluation.  PLAN FOR NEXT SESSION: nustep, light lower extremity strengthening, and AROM    Newman Pies, PTA 07/27/2022, 9:14 AM

## 2022-07-30 ENCOUNTER — Ambulatory Visit: Payer: Medicare HMO

## 2022-07-30 DIAGNOSIS — R2681 Unsteadiness on feet: Secondary | ICD-10-CM | POA: Diagnosis not present

## 2022-07-30 DIAGNOSIS — M5416 Radiculopathy, lumbar region: Secondary | ICD-10-CM

## 2022-07-30 DIAGNOSIS — M6281 Muscle weakness (generalized): Secondary | ICD-10-CM

## 2022-07-30 NOTE — Therapy (Signed)
OUTPATIENT PHYSICAL THERAPY THORACOLUMBAR TREATMENT   Patient Name: NIXON KOLTON MRN: [id number] DOB:[birth year], 71 y.o., male Today's Date: 07/30/2022   PT End of Session - 07/30/22 9024     Visit Number 3    Number of Visits 4    Date for PT Re-Evaluation 08/10/22    PT Start Time 0945    PT Stop Time 1029    PT Time Calculation (min) 44 min    Activity Tolerance Patient tolerated treatment well    Behavior During Therapy [facility] for tasks assessed/performed              Past Medical History:  Diagnosis Date   Hypertension    Past Surgical History:  Procedure Laterality Date   ANKLE SURGERY     fusion   There are no problems to display for this patient.   PCP: Elfredia Nevins, MD  REFERRING PROVIDER: Floreen Comber, NP  REFERRING DIAG: Spondylolisthesis, site unspecified  Rationale for Evaluation and Treatment Rehabilitation  THERAPY DIAG:  Radiculopathy, lumbar region  Muscle weakness (generalized)  Unsteadiness on feet  ONSET DATE: 3 years ago   SUBJECTIVE:                                                                                                                                                                                           SUBJECTIVE STATEMENT: Patient reports that his back was bothering him a little after his last appointment. He notes that his right low back and hip is bothering him right now.   PERTINENT HISTORY:  HTN, scheduled for lumbar surgery on 08/13/22  PAIN:  Are you having pain? Yes: NPRS scale: 5/10 Pain location: low back radiating down both legs Pain description: numbness, sharp Aggravating factors: movement Relieving factors: medication   PRECAUTIONS: Fall  WEIGHT BEARING RESTRICTIONS: No  FALLS:  Has patient fallen in last 6 months? Yes. Number of falls 1; he tripped causing him to fall at work   PATIENT GOALS: reduced pain, have surgery   OBJECTIVE:   DIAGNOSTIC FINDINGS: 12/09/21 Lumbar  MRI IMPRESSION: 1. At L4-5 there is a mild broad-based disc bulge. Severe bilateral facet arthropathy with ligamentum flavum infolding and a right facet effusion and reactive marrow edema on either side of the right facet joint. Bilateral lateral recess stenosis. Mild bilateral foraminal stenosis. 2. At L5-S1 there is severe bilateral facet arthropathy. Mild-moderate bilateral foraminal stenosis. 3. At L3-4 there is a mild broad-based disc bulge. Moderate bilateral facet arthropathy. Bilateral subarticular recess stenosis. Mild spinal stenosis.  SCREENING FOR RED FLAGS: Bowel or bladder incontinence: No Spinal tumors: No Cauda equina syndrome: No Compression  fracture: No Abdominal aneurysm: No  COGNITION: Overall cognitive status: Within functional limits for tasks assessed     SENSATION: Patient reports numbness in both legs below his knees. Diminished sensation to light touch bilaterally below his knees and absent sensation in both feet and ankles  POSTURE: rounded shoulders and forward head  PALPATION: TTP: bilateral lumbar paraspinals, and piriformis  JOINT MOBILITY: Lumbar spine: hypomobile and painful   LUMBAR ROM:   AROM eval  Flexion 34  Extension 12  Right lateral flexion 75% limited   Left lateral flexion 75% limited  Right rotation 50% limited; reproduced familiar pain   Left rotation 50% limited   (Blank rows = not tested)  LOWER EXTREMITY ROM:  WFL for activities assessed   LOWER EXTREMITY MMT:    MMT Right eval Left eval  Hip flexion 3+/5 4-/5  Hip extension    Hip abduction    Hip adduction    Hip internal rotation    Hip external rotation    Knee flexion 3/5 3/5  Knee extension 3/5 3+/5  Ankle dorsiflexion 3/5 3+/5  Ankle plantarflexion    Ankle inversion    Ankle eversion     (Blank rows = not tested)  FUNCTIONAL TESTS:  Required upper extremity support for sit to stand transfers  GAIT: Assistive device utilized: None Level of  assistance: Complete Independence Comments: wide BOS, difficulty walking in a straight line due to instability  TODAY'S TREATMENT:                                                                                                                              DATE:                                     10/30 EXERCISE LOG  Exercise Repetitions and Resistance Comments  Nustep  L4 x 15 minutes   Gastroc stretch  4 x 30 seconds   Seated HS stretch  4 x 30 seconds each   Marching on foam  20 reps each    Romberg balance on foam 2 minutes   SLR  20 reps each   LTR 2 minutes   Double knee to chest  30 reps  LE supported on green ball   Blank cell = exercise not performed today                                    10/27 EXERCISE LOG  Exercise Repetitions and Resistance Comments  Nustep  Lvl 3 x 10 mins   Heel/Toe Raises 20 reps each    Hip Flexion 20 reps bil   Hip Abduction 20 reps bil   Hip Extension 20 reps bil   Rockerboard x 3 mins   LAQ 2# x 20 bil   Seated Marches 2#  x 20 bil   Ball Squeezes x 3 mins   Seated Hip Abduction Red t-band x 3 mins   Ham Curls Red t-band x 20 reps bi    Blank cell = exercise not performed today   PATIENT EDUCATION:  Education details: POC, surgery, MRI results Person educated: Patient Education method: Explanation Education comprehension: verbalized understanding  HOME EXERCISE PROGRAM:   ASSESSMENT:  CLINICAL IMPRESSION: Patient was introduced to multiple new interventions for improved lumbar stability and mobility. He was limited by his elevated symptom irritability which required interventions to be modified to prevent from significantly aggravating his familiar symptoms. He reported feeling a little more sore upon the conclusion of treatment. His progressed with skilled physical therapy will be reassessed at his next appointment to determine the appropriate next steps for his plan of care.   OBJECTIVE IMPAIRMENTS: Abnormal gait, decreased  activity tolerance, decreased mobility, difficulty walking, decreased ROM, decreased strength, hypomobility, impaired sensation, postural dysfunction, and pain.   ACTIVITY LIMITATIONS: carrying, lifting, bending, standing, stairs, and locomotion level  PARTICIPATION LIMITATIONS: shopping, community activity, and occupation  PERSONAL FACTORS: Time since onset of injury/illness/exacerbation and 1 comorbidity: HTN  are also affecting patient's functional outcome.   REHAB POTENTIAL: Poor    CLINICAL DECISION MAKING: Evolving/moderate complexity  EVALUATION COMPLEXITY: Moderate   GOALS: Goals reviewed with patient? Yes  LONG TERM GOALS: Target date: 08/08/2022  Patient will be independent with his HEP.  Baseline:  Goal status: INITIAL  2.  Patient will be able to complete his daily activities without his familiar pain exceeding 7/10. Baseline:  Goal status: INITIAL  3.  Patient will be able to safely ambulate at least 80 feet without gait deviations or instability.  Baseline:  Goal status: INITIAL  PLAN:  PT FREQUENCY: 2-3x/week  PT DURATION:  4 sessions  PLANNED INTERVENTIONS: Therapeutic exercises, Therapeutic activity, Neuromuscular re-education, Balance training, Gait training, Patient/Family education, Self Care, Joint mobilization, Stair training, Electrical stimulation, Spinal mobilization, Cryotherapy, Moist heat, Manual therapy, and Re-evaluation.  PLAN FOR NEXT SESSION: nustep, light lower extremity strengthening, and AROM    Granville Lewis, PT 07/30/2022, 12:54 PM

## 2022-07-31 ENCOUNTER — Ambulatory Visit: Payer: Medicare HMO

## 2022-07-31 DIAGNOSIS — M6281 Muscle weakness (generalized): Secondary | ICD-10-CM

## 2022-07-31 DIAGNOSIS — R2681 Unsteadiness on feet: Secondary | ICD-10-CM

## 2022-07-31 DIAGNOSIS — M5416 Radiculopathy, lumbar region: Secondary | ICD-10-CM | POA: Diagnosis not present

## 2022-07-31 NOTE — Therapy (Signed)
OUTPATIENT PHYSICAL THERAPY THORACOLUMBAR TREATMENT   Patient Name: John Proctor MRN: [id number] DOB:1951/07/24, 71 y.o., male Today's Date: 07/31/2022      Past Medical History:  Diagnosis Date   Hypertension    Past Surgical History:  Procedure Laterality Date   ANKLE SURGERY     fusion   There are no problems to display for this patient.   PCP: Redmond School, MD  REFERRING PROVIDER: Patricia Nettle, NP  REFERRING DIAG: Spondylolisthesis, site unspecified  Rationale for Evaluation and Treatment Rehabilitation  THERAPY DIAG:  No diagnosis found.  ONSET DATE: 3 years ago   SUBJECTIVE:                                                                                                                                                                                           SUBJECTIVE STATEMENT: Patient reports that he feels about the same as he has been.   PERTINENT HISTORY:  HTN, scheduled for lumbar surgery on 08/13/22  PAIN:  Are you having pain? Yes: NPRS scale: 4-5/10 Pain location: low back radiating down both legs Pain description: numbness, sharp Aggravating factors: movement Relieving factors: medication   PRECAUTIONS: Fall  WEIGHT BEARING RESTRICTIONS: No  FALLS:  Has patient fallen in last 6 months? Yes. Number of falls 1; he tripped causing him to fall at work   PATIENT GOALS: reduced pain, have surgery   OBJECTIVE:   DIAGNOSTIC FINDINGS: 12/09/21 Lumbar MRI IMPRESSION: 1. At L4-5 there is a mild broad-based disc bulge. Severe bilateral facet arthropathy with ligamentum flavum infolding and a right facet effusion and reactive marrow edema on either side of the right facet joint. Bilateral lateral recess stenosis. Mild bilateral foraminal stenosis. 2. At L5-S1 there is severe bilateral facet arthropathy. Mild-moderate bilateral foraminal stenosis. 3. At L3-4 there is a mild broad-based disc bulge. Moderate bilateral facet  arthropathy. Bilateral subarticular recess stenosis. Mild spinal stenosis.  SCREENING FOR RED FLAGS: Bowel or bladder incontinence: No Spinal tumors: No Cauda equina syndrome: No Compression fracture: No Abdominal aneurysm: No  COGNITION: Overall cognitive status: Within functional limits for tasks assessed     SENSATION: Patient reports numbness in both legs below his knees. Diminished sensation to light touch bilaterally below his knees and absent sensation in both feet and ankles  POSTURE: rounded shoulders and forward head  PALPATION: TTP: bilateral lumbar paraspinals, and piriformis  JOINT MOBILITY: Lumbar spine: hypomobile and painful   LUMBAR ROM:   AROM eval 10/31 discharge   Flexion 34 32  Extension 12 10  Right lateral flexion 75% limited  75% limited; reproduced familiar pain   Left lateral  flexion 75% limited 75% limited; reproduced familiar pain   Right rotation 50% limited; reproduced familiar pain  50% limited; reproduced familiar pain   Left rotation 50% limited 50% limited   (Blank rows = not tested)  LOWER EXTREMITY ROM:  WFL for activities assessed   LOWER EXTREMITY MMT:    MMT Right eval Left eval  Hip flexion 3+/5 4-/5  Hip extension    Hip abduction    Hip adduction    Hip internal rotation    Hip external rotation    Knee flexion 3/5 3/5  Knee extension 3/5 3+/5  Ankle dorsiflexion 3/5 3+/5  Ankle plantarflexion    Ankle inversion    Ankle eversion     (Blank rows = not tested)  FUNCTIONAL TESTS:  Required upper extremity support for sit to stand transfers  GAIT: Assistive device utilized: None Level of assistance: Complete Independence Comments: wide BOS, difficulty walking in a straight line due to instability  TODAY'S TREATMENT:                                                                                                                              DATE:                                     10/31 EXERCISE LOG  Exercise  Repetitions and Resistance Comments  Nustep L3 x 15 minutes   Isometric ball press 20 reps w/ 5 second hold                Blank cell = exercise not performed today                                    10/30 EXERCISE LOG  Exercise Repetitions and Resistance Comments  Nustep  L4 x 15 minutes   Gastroc stretch  4 x 30 seconds   Seated HS stretch  4 x 30 seconds each   Marching on foam  20 reps each    Romberg balance on foam 2 minutes   SLR  20 reps each   LTR 2 minutes   Double knee to chest  30 reps  LE supported on green ball   Blank cell = exercise not performed today                                    10/27 EXERCISE LOG  Exercise Repetitions and Resistance Comments  Nustep  Lvl 3 x 10 mins   Heel/Toe Raises 20 reps each    Hip Flexion 20 reps bil   Hip Abduction 20 reps bil   Hip Extension 20 reps bil   Rockerboard x 3 mins   LAQ 2# x 20 bil  Seated Marches 2# x 20 bil   Ball Squeezes x 3 mins   Seated Hip Abduction Red t-band x 3 mins   Ham Curls Red t-band x 20 reps bi    Blank cell = exercise not performed today   PATIENT EDUCATION:  Education details: surgery, prognosis after surgery, healing, and wearing a back brace Person educated: Patient Education method: Explanation Education comprehension: verbalized understanding  HOME EXERCISE PROGRAM:   ASSESSMENT:  CLINICAL IMPRESSION: Patient has made minimal progress with skilled physical therapy as evidenced by his subjective reports, objective measures, and progress toward his goals. He was unable to meet his goals for reduced pain severity and improved gait mechanics. He was educated on healing following a lumbar surgery. He reported feeling comfortable with his HEP. He is being discharged at this time due to a lack of progress with skilled physical therapy.   PHYSICAL THERAPY DISCHARGE SUMMARY  Visits from Start of Care: 4  Current functional level related to goals / functional outcomes: Patient was  unable to make significant progress with skilled physical therapy.    Remaining deficits: No significant change from his initial evaluation    Education / Equipment: HEP   Patient agrees to discharge. Patient goals were partially met. Patient is being discharged due to lack of progress.   OBJECTIVE IMPAIRMENTS: Abnormal gait, decreased activity tolerance, decreased mobility, difficulty walking, decreased ROM, decreased strength, hypomobility, impaired sensation, postural dysfunction, and pain.   ACTIVITY LIMITATIONS: carrying, lifting, bending, standing, stairs, and locomotion level  PARTICIPATION LIMITATIONS: shopping, community activity, and occupation  PERSONAL FACTORS: Time since onset of injury/illness/exacerbation and 1 comorbidity: HTN  are also affecting patient's functional outcome.   REHAB POTENTIAL: Poor    CLINICAL DECISION MAKING: Evolving/moderate complexity  EVALUATION COMPLEXITY: Moderate   GOALS: Goals reviewed with patient? Yes  LONG TERM GOALS: Target date: 08/08/2022  Patient will be independent with his HEP.  Baseline:  Goal status: MET  2.  Patient will be able to complete his daily activities without his familiar pain exceeding 7/10. Baseline: Continues to require pain medication to help control his pain  Goal status: NOT MET  3.  Patient will be able to safely ambulate at least 80 feet without gait deviations or instability.  Baseline: no significant change since initial evaluation Goal status: NOT MET  PLAN:  PT FREQUENCY: 2-3x/week  PT DURATION:  4 sessions  PLANNED INTERVENTIONS: Therapeutic exercises, Therapeutic activity, Neuromuscular re-education, Balance training, Gait training, Patient/Family education, Self Care, Joint mobilization, Stair training, Electrical stimulation, Spinal mobilization, Cryotherapy, Moist heat, Manual therapy, and Re-evaluation.  PLAN FOR NEXT SESSION: nustep, light lower extremity strengthening, and AROM     Darlin Coco, PT 07/31/2022, 8:11 AM

## 2022-07-31 NOTE — Pre-Procedure Instructions (Signed)
Surgical Instructions    Your procedure is scheduled on Monday 08/13/22.   Report to [facility] Main Entrance "A" at 10:15 A.M., then check in with the Admitting office.  Call this number if you have problems the morning of surgery:  [phone]   If you have any questions prior to your surgery date call [phone]: Open Monday-Friday 8am-4pm If you experience any cold or flu symptoms such as cough, fever, chills, shortness of breath, etc. between now and your scheduled surgery, please notify us at the above number     Remember:  Do not eat or drink after midnight the night before your surgery     Take these medicines the morning of surgery with A SIP OF WATER:   amLODipine (NORVASC)  metoprolol succinate (TOPROL-XL)  rosuvastatin (CRESTOR)   oxyCODONE-acetaminophen (PERCOCET/ROXICET)- If needed   As of today, STOP taking any Aspirin (unless otherwise instructed by your surgeon) Aleve, Naproxen, Ibuprofen, Motrin, Advil, Goody's, BC's, all herbal medications, fish oil, and all vitamins.           Do not wear jewelry or makeup. Do not wear lotions, powders, perfumes/cologne or deodorant. Do not shave 48 hours prior to surgery.  Men may shave face and neck. Do not bring valuables to the hospital. Do not wear nail polish, gel polish, artificial nails, or any other type of covering on natural nails (fingers and toes) If you have artificial nails or gel coating that need to be removed by a nail salon, please have this removed prior to surgery. Artificial nails or gel coating may interfere with anesthesia's ability to adequately monitor your vital signs.  [facility] is not responsible for any belongings or valuables.    Do NOT Smoke (Tobacco/Vaping)  24 hours prior to your procedure  If you use a CPAP at night, you may bring your mask for your overnight stay.   Contacts, glasses, hearing aids, dentures or partials may not be worn into surgery, please bring cases for these  belongings   For patients admitted to the hospital, discharge time will be determined by your treatment team.   Patients discharged the day of surgery will not be allowed to drive home, and someone needs to stay with them for 24 hours.   SURGICAL WAITING ROOM VISITATION Patients having surgery or a procedure may have no more than 2 support people in the waiting area - these visitors may rotate.   Children under the age of 53 must have an adult with them who is not the patient. If the patient needs to stay at the hospital during part of their recovery, the visitor guidelines for inpatient rooms apply. Pre-op nurse will coordinate an appropriate time for 1 support person to accompany patient in pre-op.  This support person may not rotate.   Please refer to [email or web address] for the visitor guidelines for Inpatients (after your surgery is over and you are in a regular room).    Special instructions:    Oral Hygiene is also important to reduce your risk of infection.  Remember - BRUSH YOUR TEETH THE MORNING OF SURGERY WITH YOUR REGULAR TOOTHPASTE   [facility]- Preparing For Surgery  Before surgery, you can play an important role. Because skin is not sterile, your skin needs to be as free of germs as possible. You can reduce the number of germs on your skin by washing with CHG (chlorahexidine gluconate) Soap before surgery.  CHG is an antiseptic cleaner which kills germs and bonds with the skin  to continue killing germs even after washing.     Please do not use if you have an allergy to CHG or antibacterial soaps. If your skin becomes reddened/irritated stop using the CHG.  Do not shave (including legs and underarms) for at least 48 hours prior to first CHG shower. It is OK to shave your face.  Please follow these instructions carefully.     Shower the NIGHT BEFORE SURGERY and the MORNING OF SURGERY with CHG Soap.   If you chose to wash  your hair, wash your hair first as usual with your normal shampoo. After you shampoo, rinse your hair and body thoroughly to remove the shampoo.  Then [employer] and genitals (private parts) with your normal soap and rinse thoroughly to remove soap.  After that Use CHG Soap as you would any other liquid soap. You can apply CHG directly to the skin and wash gently with a scrungie or a clean washcloth.   Apply the CHG Soap to your body ONLY FROM THE NECK DOWN.  Do not use on open wounds or open sores. Avoid contact with your eyes, ears, mouth and genitals (private parts). Wash Face and genitals (private parts)  with your normal soap.   Wash thoroughly, paying special attention to the area where your surgery will be performed.  Thoroughly rinse your body with warm water from the neck down.  DO NOT shower/wash with your normal soap after using and rinsing off the CHG Soap.  Pat yourself dry with a CLEAN TOWEL.  Wear CLEAN PAJAMAS to bed the night before surgery  Place CLEAN SHEETS on your bed the night before your surgery  DO NOT SLEEP WITH PETS.   Day of Surgery:  Take a shower with CHG soap. Wear Clean/Comfortable clothing the morning of surgery Do not apply any deodorants/lotions.   Remember to brush your teeth WITH YOUR REGULAR TOOTHPASTE.    If you received a COVID test during your pre-op visit, it is requested that you wear a mask when out in public, stay away from anyone that may not be feeling well, and notify your surgeon if you develop symptoms. If you have been in contact with anyone that has tested positive in the last 10 days, please notify your surgeon.    Please read over the following fact sheets that you were given.

## 2022-08-01 ENCOUNTER — Encounter (HOSPITAL_COMMUNITY): Payer: Self-pay

## 2022-08-01 ENCOUNTER — Other Ambulatory Visit: Payer: Self-pay

## 2022-08-01 ENCOUNTER — Encounter (HOSPITAL_COMMUNITY)
Admission: RE | Admit: 2022-08-01 | Discharge: 2022-08-01 | Disposition: A | Discharge status: Home / Self Care | Payer: Medicare HMO | Source: Ambulatory Visit | Attending: Neurosurgery | Admitting: Neurosurgery

## 2022-08-01 VITALS — BP 164/85 | HR 73 | Temp 97.5°F | Resp 17 | Ht 76.0 in | Wt 175.0 lb

## 2022-08-01 DIAGNOSIS — Z01812 Encounter for preprocedural laboratory examination: Secondary | ICD-10-CM | POA: Insufficient documentation

## 2022-08-01 DIAGNOSIS — Z01818 Encounter for other preprocedural examination: Secondary | ICD-10-CM

## 2022-08-01 DIAGNOSIS — Z87891 Personal history of nicotine dependence: Secondary | ICD-10-CM | POA: Insufficient documentation

## 2022-08-01 DIAGNOSIS — M4310 Spondylolisthesis, site unspecified: Secondary | ICD-10-CM | POA: Diagnosis not present

## 2022-08-01 DIAGNOSIS — I10 Essential (primary) hypertension: Secondary | ICD-10-CM | POA: Insufficient documentation

## 2022-08-01 LAB — BASIC METABOLIC PANEL
Anion gap: 7 (ref 5–15)
BUN: 17 mg/dL (ref 8–23)
CO2: 28 mmol/L (ref 22–32)
Calcium: 9.5 mg/dL (ref 8.9–10.3)
Chloride: 108 mmol/L (ref 98–111)
Creatinine, Ser: 1.12 mg/dL (ref 0.61–1.24)
GFR, Estimated: >60 mL/min (ref >60)
Glucose, Bld: 106 mg/dL — ABNORMAL HIGH (ref 70–99)
Potassium: 3.9 mmol/L (ref 3.5–5.1)
Sodium: 143 mmol/L (ref 135–145)

## 2022-08-01 LAB — TYPE AND SCREEN
ABO/RH(D): AB POS
Antibody Screen: NEGATIVE

## 2022-08-01 LAB — CBC
HCT: 43.1 % (ref 39.0–52.0)
Hemoglobin: 13.8 g/dL (ref 13.0–17.0)
MCH: 28.3 pg (ref 26.0–34.0)
MCHC: 32 g/dL (ref 30.0–36.0)
MCV: 88.5 fL (ref 80.0–100.0)
Platelets: 244 10*3/uL (ref 150–400)
RBC: 4.87 MIL/uL (ref 4.22–5.81)
RDW: 13 % (ref 11.5–15.5)
WBC: 6.8 10*3/uL (ref 4.0–10.5)
nRBC: 0 % (ref 0.0–0.2)

## 2022-08-01 LAB — SURGICAL PCR SCREEN
MRSA, PCR: NEGATIVE
Staphylococcus aureus: POSITIVE — AB

## 2022-08-01 NOTE — Progress Notes (Signed)
PCP - Dr. Redmond School Cardiologist - denies  PPM/ICD - denies   Chest x-ray - 05/17/22 EKG - 05/17/22- records requested Stress Test - 10+ years ago per pt, normal per pt ECHO - denies Cardiac Cath - denies  Sleep Study - denies   DM- denies  Last dose of GLP1 agonist-  n/a   Blood Thinner Instructions: n/a Aspirin Instructions: f/u with surgeon. Pt sees Dr. Annette Stable this evening at 3 pm  ERAS Protcol - no, NPO   COVID TEST- n/a   Anesthesia review: yes. Pt was seen at [facility]  05/17/22 for chest pain that was apparently ruled as pancreatitis. Jeneen Rinks, APP aware. Looks like there was a referral to cardiology. Pt never saw cardiology. He said he f/u with Dr. Gerarda Fraction and did not need to see cardiology. Pt denies any recurrent or worsening symptoms. He says that is all resolved.  Patient denies shortness of breath, fever, cough and chest pain at PAT appointment   All instructions explained to the patient, with a verbal understanding of the material. Patient agrees to go over the instructions while at home for a better understanding. The opportunity to ask questions was provided.

## 2022-08-02 NOTE — Progress Notes (Addendum)
Anesthesia Chart Review:  Case: [id number] Date/Time: 08/13/22 1200   Procedure: PLIF - L4-L5 - L5-S1 (Back)   Anesthesia type: General   Pre-op diagnosis: Spondylolisthesis   Location: [facility] OR ROOM 18 / [facility] OR   Surgeons: Julio Sicks, MD       DISCUSSION: Patient is a 71 year old male scheduled for the above procedure.   History includes former smoker (quit 08/10/12), HTN, ankle surgery. Alcohol intake was reported as ~ 1 beer/day.   [facility] ED visit on 05/17/22 for chest pain and discharged with diagnosis of acute pancreatitis, decreased stamina. By ED notes, "Over the last 2 weeks, he has been experiencing weakness, fatigue, dizziness, SOB and malaise while mowing his yard, but today also had some dull central chest pain and sweating. The pain is constant at a 3, but does not radiate." EKG normal. Troponin negative x3. UDS negative except for oxycodone. Creatinine 1.37. WBC 7.9. Pro-BNP 314 (0-125). Lipase elevated at 172 (12-53). CXR showed no acute process. He was advised to increase clear liquids, follow bland/BRATY diet, avoid alcohol. Out-patient PCP and cardiology follow-up. He said he had PCP follow-up with Dr. Sherwood Gambler but did not require cardiology follow-up. Records received. Dr. Sherwood Gambler noted review of ED records. Fatigue and epigastric abdominal pain reamined the same and athypical chest pain improving. Appears repeat labs ordered (Amylase, Lipase, cardiac enzymes, CMP) but were not sent with records received. He denied chest pain and SOB at PAT visit.   I discussed with anesthesiologist Marcene Duos, MD. Recommend preoperative medical clearance and defer decision for additional testing or referral, if any, as long as no recurrent chest/CV symptoms. I called and spoke with Mr. Wotton. He says chest/epigastric symptoms were attributed to pancreatitis and have resolved. He said he was unsure if pancreatitis could have been related to his gallbladder or alcohol use. He does drink on  average 1 beer a day. He has mowed his lawn several times since August (he uses a push and Education administrator on his property) and has not had any recurrent chest symptoms. He denied SOB, palpitations, syncope. He tries to stay active and is still mobile, but will have increased back pain and right hip/RLE > LLE with prolonged sitting or standing. He has noticed mild intermittent right foot swelling since his back/RLE issues began, but no issues with calf swelling or any difficult with shoe fitting. He denied CV symptoms including no exertional symptoms with lawn moving since his August ED visit. Advised that we would ask Dr. Jordan Likes to touch base with Dr. Sherwood Gambler about surgery plans. I spoke with Morrie Sheldon at Dr. Lindalou Hose office and Erie Noe will send a request for medical clearance to Dr. Sherwood Gambler.   ADDENDUM 08/10/22 4:34 PM:  Per my previous conversation with Mr. Edmonston, he denied further chest symptoms since August ED visit, and he had been able to mow his lawn (combination of push and riding mower) without CV symptoms suggesting METS of at least 4. Dr. Sherwood Gambler has been out of the office this week but will return on 08/13/22 AM. Erie Noe from Dr. Dalphine Handing' office and patient have reportedly been in communication with his staff who plan to follow-up prior to his scheduled surgery on Monday.    VS: BP (!) 164/85   Pulse 73   Temp (!) 36.4 C   Resp 17   Ht 6\' 4"  (1.93 m)   Wt 79.4 kg   SpO2 100%   BMI 21.30 kg/m    PROVIDERS: , MD  is PCP [facility], [phone])   LABS: Labs reviewed: Acceptable for surgery. (all labs ordered are listed, but only abnormal results are displayed)  Labs Reviewed  SURGICAL PCR SCREEN - Abnormal; Notable for the following components:      Result Value   Staphylococcus aureus POSITIVE (*)    All other components within normal limits  BASIC METABOLIC PANEL - Abnormal; Notable for the following components:   Glucose, Bld 106 (*)    All other  components within normal limits  CBC  TYPE AND SCREEN     IMAGES: MRI L-spine 12/09/21: IMPRESSION: 1. At L4-5 there is a mild broad-based disc bulge. Severe bilateral facet arthropathy with ligamentum flavum infolding and a right facet effusion and reactive marrow edema on either side of the right facet joint. Bilateral lateral recess stenosis. Mild bilateral foraminal stenosis. 2. At L5-S1 there is severe bilateral facet arthropathy. Mild-moderate bilateral foraminal stenosis. 3. At L3-4 there is a mild broad-based disc bulge. Moderate bilateral facet arthropathy. Bilateral subarticular recess stenosis. Mild spinal stenosis.   CXR 05/17/22 [facility]): FINDINGS:  Normal heart size, mediastinal contours, and pulmonary vascularity.  Atherosclerotic calcification aorta.  Lungs clear.  No pulmonary infiltrate, pleural effusion, or pneumothorax.    EKG: 05/17/22 19:18:25 [facility]): NSR   CV: He reported a normal stress test 10+ years ago.   Past Medical History:  Diagnosis Date   Hypertension     Past Surgical History:  Procedure Laterality Date   ANKLE SURGERY Right    fusion    MEDICATIONS:  amLODipine (NORVASC) 10 MG tablet   aspirin EC 81 MG tablet   lisinopril (ZESTRIL) 40 MG tablet   metoprolol succinate (TOPROL-XL) 25 MG 24 hr tablet   naproxen sodium (ALEVE) 220 MG tablet   oxyCODONE-acetaminophen (PERCOCET/ROXICET) 5-325 MG tablet   rosuvastatin (CRESTOR) 20 MG tablet   No current facility-administered medications for this encounter.    Myra Gianotti, PA-C Surgical Short Stay/Anesthesiology [facility] Phone [phone] [facility] Phone [phone] 08/03/2022 9:58 AM

## 2022-08-06 DIAGNOSIS — M4316 Spondylolisthesis, lumbar region: Secondary | ICD-10-CM | POA: Diagnosis not present

## 2022-08-07 ENCOUNTER — Other Ambulatory Visit: Payer: Self-pay | Admitting: Radiology

## 2022-08-07 ENCOUNTER — Telehealth: Payer: Self-pay

## 2022-08-07 MED ORDER — OXYCODONE-ACETAMINOPHEN 5-325 MG PO TABS: 1.0000 | ORAL_TABLET | Freq: Four times a day (QID) | ORAL | 0 refills | Status: DC | PRN

## 2022-08-07 NOTE — Telephone Encounter (Signed)
Patient calls asking for refill oxycodone.  Asks for a call to confirm, message received.

## 2022-08-08 MED ORDER — OXYCODONE-ACETAMINOPHEN 5-325 MG PO TABS: 1.0000 | ORAL_TABLET | Freq: Four times a day (QID) | ORAL | 0 refills | Status: DC | PRN

## 2022-08-10 NOTE — Telephone Encounter (Signed)
done

## 2022-08-10 NOTE — Anesthesia Preprocedure Evaluation (Signed)
Anesthesia Evaluation  Patient identified by MRN, date of birth, ID band Patient awake    Reviewed: Allergy & Precautions, NPO status , Patient's Chart, lab work & pertinent test results, reviewed documented beta blocker date and time   Airway Mallampati: I  TM Distance: >3 FB Neck ROM: Full    Dental  (+) Chipped,    Pulmonary former smoker   Pulmonary exam normal breath sounds clear to auscultation       Cardiovascular METS: 5 - 7 Mets hypertension, Pt. on medications and Pt. on home beta blockers  Rhythm:Regular Rate:Normal     Neuro/Psych negative neurological ROS     GI/Hepatic negative GI ROS, Neg liver ROS,,,  Endo/Other  negative endocrine ROS    Renal/GU Renal disease     Musculoskeletal negative musculoskeletal ROS (+)    Abdominal   Peds  Hematology negative hematology ROS (+)   Anesthesia Other Findings   Reproductive/Obstetrics                             Anesthesia Physical Anesthesia Plan  ASA: 2  Anesthesia Plan: General   Post-op Pain Management: Tylenol PO (pre-op)* and Gabapentin PO (pre-op)*   Induction: Intravenous  PONV Risk Score and Plan: 3 and Ondansetron, Dexamethasone and Treatment may vary due to age or medical condition  Airway Management Planned: Oral ETT  Additional Equipment:   Intra-op Plan:   Post-operative Plan: Extubation in OR  Informed Consent: I have reviewed the patients History and Physical, chart, labs and discussed the procedure including the risks, benefits and alternatives for the proposed anesthesia with the patient or authorized representative who has indicated his/her understanding and acceptance.     Dental advisory given  Plan Discussed with: CRNA  Anesthesia Plan Comments: (Pt did not see PCP for medical clearance as PCP was out of office. PCP refused to "clear" patient without clinic visit. I spoke in depth with Mr.  Silguero about his physical status. He is able to push a mower for ~30 minutes before needing to stop to rest because "I'm old and my legs get tired." He denies CP, SOB, DOE. I discussed that delaying surgery for PCP follow up would likely be low yield. He wants to proceed with surgery today.  See PAT note written 08/10/2022 by Shonna Chock, PA-C.  )       Anesthesia Quick Evaluation

## 2022-08-13 ENCOUNTER — Other Ambulatory Visit: Payer: Self-pay

## 2022-08-13 ENCOUNTER — Encounter (HOSPITAL_COMMUNITY)
Admission: RE | Disposition: A | Discharge status: Home / Self Care | Payer: Self-pay | Source: Home / Self Care | Attending: Neurosurgery

## 2022-08-13 ENCOUNTER — Encounter (HOSPITAL_COMMUNITY): Payer: Self-pay | Admitting: Neurosurgery

## 2022-08-13 ENCOUNTER — Ambulatory Visit (HOSPITAL_COMMUNITY): Payer: Medicare HMO

## 2022-08-13 ENCOUNTER — Ambulatory Visit (HOSPITAL_BASED_OUTPATIENT_CLINIC_OR_DEPARTMENT_OTHER): Payer: Medicare HMO | Admitting: Anesthesiology

## 2022-08-13 ENCOUNTER — Observation Stay (HOSPITAL_COMMUNITY)
Admission: RE | Admit: 2022-08-13 | Discharge: 2022-08-14 | Disposition: A | Discharge status: Home / Self Care | Payer: Medicare HMO | Attending: Neurosurgery | Admitting: Neurosurgery

## 2022-08-13 ENCOUNTER — Ambulatory Visit (HOSPITAL_COMMUNITY): Payer: Medicare HMO | Admitting: Physician Assistant

## 2022-08-13 DIAGNOSIS — I10 Essential (primary) hypertension: Secondary | ICD-10-CM | POA: Diagnosis not present

## 2022-08-13 DIAGNOSIS — M48061 Spinal stenosis, lumbar region without neurogenic claudication: Secondary | ICD-10-CM | POA: Diagnosis not present

## 2022-08-13 DIAGNOSIS — M4807 Spinal stenosis, lumbosacral region: Secondary | ICD-10-CM | POA: Diagnosis not present

## 2022-08-13 DIAGNOSIS — M4317 Spondylolisthesis, lumbosacral region: Secondary | ICD-10-CM

## 2022-08-13 DIAGNOSIS — Z87891 Personal history of nicotine dependence: Secondary | ICD-10-CM | POA: Insufficient documentation

## 2022-08-13 DIAGNOSIS — Z79899 Other long term (current) drug therapy: Secondary | ICD-10-CM | POA: Diagnosis not present

## 2022-08-13 DIAGNOSIS — Z7982 Long term (current) use of aspirin: Secondary | ICD-10-CM | POA: Diagnosis not present

## 2022-08-13 DIAGNOSIS — M4316 Spondylolisthesis, lumbar region: Secondary | ICD-10-CM | POA: Diagnosis not present

## 2022-08-13 DIAGNOSIS — Z981 Arthrodesis status: Secondary | ICD-10-CM | POA: Diagnosis not present

## 2022-08-13 HISTORY — DX: Chronic kidney disease, unspecified: N18.9

## 2022-08-13 LAB — ABO/RH: ABO/RH(D): AB POS

## 2022-08-13 SURGERY — POSTERIOR LUMBAR FUSION 2 LEVEL
Anesthesia: General | Site: Back

## 2022-08-13 MED ORDER — POLYETHYLENE GLYCOL 3350 17 G PO PACK: 17.0000 g | PACK | Freq: Every day | ORAL | Status: DC | PRN

## 2022-08-13 MED ORDER — FENTANYL CITRATE (PF) 250 MCG/5ML IJ SOLN
INTRAMUSCULAR | Status: AC
  Filled 2022-08-13: qty 5

## 2022-08-13 MED ORDER — AMISULPRIDE (ANTIEMETIC) 5 MG/2ML IV SOLN: 10.0000 mg | Freq: Once | INTRAVENOUS | Status: DC | PRN

## 2022-08-13 MED ORDER — LACTATED RINGERS IV SOLN: INTRAVENOUS | Status: DC

## 2022-08-13 MED ORDER — PHENYLEPHRINE HCL-NACL 20-0.9 MG/250ML-% IV SOLN
INTRAVENOUS | Status: DC | PRN
  Administered 2022-08-13: 30 ug/min via INTRAVENOUS

## 2022-08-13 MED ORDER — CHLORHEXIDINE GLUCONATE CLOTH 2 % EX PADS: 6.0000 | MEDICATED_PAD | Freq: Once | CUTANEOUS | Status: DC

## 2022-08-13 MED ORDER — ACETAMINOPHEN 325 MG PO TABS
650.0000 mg | ORAL_TABLET | ORAL | Status: DC | PRN
  Administered 2022-08-14: 650 mg via ORAL
  Filled 2022-08-13: qty 2

## 2022-08-13 MED ORDER — BUPIVACAINE HCL (PF) 0.25 % IJ SOLN
INTRAMUSCULAR | Status: DC | PRN
  Administered 2022-08-13: 10 mL

## 2022-08-13 MED ORDER — HYDROMORPHONE HCL 1 MG/ML IJ SOLN
INTRAMUSCULAR | Status: AC
  Filled 2022-08-13: qty 1

## 2022-08-13 MED ORDER — LIDOCAINE 2% (20 MG/ML) 5 ML SYRINGE
INTRAMUSCULAR | Status: DC | PRN
  Administered 2022-08-13: 70 mg via INTRAVENOUS

## 2022-08-13 MED ORDER — ONDANSETRON HCL 4 MG PO TABS: 4.0000 mg | ORAL_TABLET | Freq: Four times a day (QID) | ORAL | Status: DC | PRN

## 2022-08-13 MED ORDER — ASPIRIN 81 MG PO TBEC
81.0000 mg | DELAYED_RELEASE_TABLET | Freq: Every day | ORAL | Status: DC
  Administered 2022-08-14: 81 mg via ORAL
  Filled 2022-08-13: qty 1

## 2022-08-13 MED ORDER — DIAZEPAM 5 MG PO TABS: 5.0000 mg | ORAL_TABLET | Freq: Four times a day (QID) | ORAL | Status: DC | PRN

## 2022-08-13 MED ORDER — ONDANSETRON HCL 4 MG/2ML IJ SOLN
INTRAMUSCULAR | Status: AC
  Filled 2022-08-13: qty 2

## 2022-08-13 MED ORDER — THROMBIN 20000 UNITS EX SOLR
CUTANEOUS | Status: DC | PRN
  Administered 2022-08-13: 20 mL via TOPICAL

## 2022-08-13 MED ORDER — EPHEDRINE SULFATE-NACL 50-0.9 MG/10ML-% IV SOSY
PREFILLED_SYRINGE | INTRAVENOUS | Status: DC | PRN
  Administered 2022-08-13 (×2): 5 mg via INTRAVENOUS

## 2022-08-13 MED ORDER — VANCOMYCIN HCL 1000 MG IV SOLR
INTRAVENOUS | Status: DC | PRN
  Administered 2022-08-13: 1000 mg via TOPICAL

## 2022-08-13 MED ORDER — LISINOPRIL 20 MG PO TABS
40.0000 mg | ORAL_TABLET | Freq: Every day | ORAL | Status: DC
  Administered 2022-08-13 – 2022-08-14 (×2): 40 mg via ORAL
  Filled 2022-08-13 (×2): qty 2

## 2022-08-13 MED ORDER — ACETAMINOPHEN 650 MG RE SUPP: 650.0000 mg | RECTAL | Status: DC | PRN

## 2022-08-13 MED ORDER — CEFAZOLIN SODIUM-DEXTROSE 2-4 GM/100ML-% IV SOLN
2.0000 g | INTRAVENOUS | Status: AC
  Administered 2022-08-13: 2 g via INTRAVENOUS
  Filled 2022-08-13: qty 100

## 2022-08-13 MED ORDER — OXYCODONE HCL 5 MG PO TABS
10.0000 mg | ORAL_TABLET | ORAL | Status: DC | PRN
  Administered 2022-08-13 – 2022-08-14 (×3): 10 mg via ORAL
  Filled 2022-08-13 (×4): qty 2

## 2022-08-13 MED ORDER — AMLODIPINE BESYLATE 5 MG PO TABS
10.0000 mg | ORAL_TABLET | Freq: Every day | ORAL | Status: DC
  Administered 2022-08-14: 10 mg via ORAL
  Filled 2022-08-13 (×2): qty 2

## 2022-08-13 MED ORDER — VANCOMYCIN HCL 1000 MG IV SOLR
INTRAVENOUS | Status: AC
  Filled 2022-08-13: qty 20

## 2022-08-13 MED ORDER — HYDROMORPHONE HCL 1 MG/ML IJ SOLN: 1.0000 mg | INTRAMUSCULAR | Status: DC | PRN

## 2022-08-13 MED ORDER — SODIUM CHLORIDE 0.9% FLUSH: 3.0000 mL | INTRAVENOUS | Status: DC | PRN

## 2022-08-13 MED ORDER — PHENYLEPHRINE 80 MCG/ML (10ML) SYRINGE FOR IV PUSH (FOR BLOOD PRESSURE SUPPORT)
PREFILLED_SYRINGE | INTRAVENOUS | Status: DC | PRN
  Administered 2022-08-13: 160 ug via INTRAVENOUS
  Administered 2022-08-13: 120 ug via INTRAVENOUS
  Administered 2022-08-13: 80 ug via INTRAVENOUS
  Administered 2022-08-13: 120 ug via INTRAVENOUS
  Administered 2022-08-13 (×3): 80 ug via INTRAVENOUS

## 2022-08-13 MED ORDER — PHENOL 1.4 % MT LIQD: 1.0000 | OROMUCOSAL | Status: DC | PRN

## 2022-08-13 MED ORDER — HYDROCODONE-ACETAMINOPHEN 10-325 MG PO TABS: 1.0000 | ORAL_TABLET | ORAL | Status: DC | PRN

## 2022-08-13 MED ORDER — MIDAZOLAM HCL 2 MG/2ML IJ SOLN
INTRAMUSCULAR | Status: AC
  Filled 2022-08-13: qty 2

## 2022-08-13 MED ORDER — PROMETHAZINE HCL 25 MG/ML IJ SOLN: 6.2500 mg | INTRAMUSCULAR | Status: DC | PRN

## 2022-08-13 MED ORDER — SODIUM CHLORIDE (PF) 0.9 % IJ SOLN
INTRAMUSCULAR | Status: AC
  Filled 2022-08-13: qty 10

## 2022-08-13 MED ORDER — FLEET ENEMA 7-19 GM/118ML RE ENEM: 1.0000 | ENEMA | Freq: Once | RECTAL | Status: DC | PRN

## 2022-08-13 MED ORDER — HYDROMORPHONE HCL 1 MG/ML IJ SOLN
0.2500 mg | INTRAMUSCULAR | Status: DC | PRN
  Administered 2022-08-13 (×3): 0.5 mg via INTRAVENOUS

## 2022-08-13 MED ORDER — ONDANSETRON HCL 4 MG/2ML IJ SOLN
INTRAMUSCULAR | Status: DC | PRN
  Administered 2022-08-13: 4 mg via INTRAVENOUS

## 2022-08-13 MED ORDER — MENTHOL 3 MG MT LOZG: 1.0000 | LOZENGE | OROMUCOSAL | Status: DC | PRN

## 2022-08-13 MED ORDER — SUGAMMADEX SODIUM 200 MG/2ML IV SOLN
INTRAVENOUS | Status: DC | PRN
  Administered 2022-08-13 (×2): 100 mg via INTRAVENOUS

## 2022-08-13 MED ORDER — METOPROLOL SUCCINATE ER 25 MG PO TB24
25.0000 mg | ORAL_TABLET | Freq: Every day | ORAL | Status: DC
  Administered 2022-08-14: 25 mg via ORAL
  Filled 2022-08-13: qty 1

## 2022-08-13 MED ORDER — EPHEDRINE 5 MG/ML INJ
INTRAVENOUS | Status: AC
  Filled 2022-08-13: qty 5

## 2022-08-13 MED ORDER — ROCURONIUM BROMIDE 10 MG/ML (PF) SYRINGE
PREFILLED_SYRINGE | INTRAVENOUS | Status: AC
  Filled 2022-08-13: qty 20

## 2022-08-13 MED ORDER — ORAL CARE MOUTH RINSE: 15.0000 mL | Freq: Once | OROMUCOSAL | Status: AC

## 2022-08-13 MED ORDER — CHLORHEXIDINE GLUCONATE 0.12 % MT SOLN
15.0000 mL | Freq: Once | OROMUCOSAL | Status: AC
  Administered 2022-08-13: 15 mL via OROMUCOSAL
  Filled 2022-08-13: qty 15

## 2022-08-13 MED ORDER — DEXAMETHASONE SODIUM PHOSPHATE 10 MG/ML IJ SOLN
INTRAMUSCULAR | Status: AC
  Filled 2022-08-13: qty 1

## 2022-08-13 MED ORDER — SODIUM CHLORIDE 0.9% FLUSH: 3.0000 mL | Freq: Two times a day (BID) | INTRAVENOUS | Status: DC

## 2022-08-13 MED ORDER — BUPIVACAINE HCL (PF) 0.25 % IJ SOLN
INTRAMUSCULAR | Status: AC
  Filled 2022-08-13: qty 30

## 2022-08-13 MED ORDER — ACETAMINOPHEN 500 MG PO TABS
1000.0000 mg | ORAL_TABLET | Freq: Once | ORAL | Status: AC
  Administered 2022-08-13: 1000 mg via ORAL
  Filled 2022-08-13: qty 2

## 2022-08-13 MED ORDER — THROMBIN 20000 UNITS EX SOLR
CUTANEOUS | Status: AC
  Filled 2022-08-13: qty 20000

## 2022-08-13 MED ORDER — FENTANYL CITRATE (PF) 250 MCG/5ML IJ SOLN
INTRAMUSCULAR | Status: DC | PRN
  Administered 2022-08-13 (×4): 25 ug via INTRAVENOUS
  Administered 2022-08-13: 100 ug via INTRAVENOUS
  Administered 2022-08-13: 50 ug via INTRAVENOUS

## 2022-08-13 MED ORDER — MEPERIDINE HCL 25 MG/ML IJ SOLN: 6.2500 mg | INTRAMUSCULAR | Status: DC | PRN

## 2022-08-13 MED ORDER — SODIUM CHLORIDE 0.9 % IV SOLN: INTRAVENOUS | Status: DC | PRN

## 2022-08-13 MED ORDER — DEXAMETHASONE SODIUM PHOSPHATE 10 MG/ML IJ SOLN
INTRAMUSCULAR | Status: DC | PRN
  Administered 2022-08-13: 8 mg via INTRAVENOUS

## 2022-08-13 MED ORDER — ROCURONIUM BROMIDE 10 MG/ML (PF) SYRINGE
PREFILLED_SYRINGE | INTRAVENOUS | Status: DC | PRN
  Administered 2022-08-13 (×2): 20 mg via INTRAVENOUS
  Administered 2022-08-13: 10 mg via INTRAVENOUS
  Administered 2022-08-13: 60 mg via INTRAVENOUS
  Administered 2022-08-13: 10 mg via INTRAVENOUS

## 2022-08-13 MED ORDER — PROPOFOL 10 MG/ML IV BOLUS
INTRAVENOUS | Status: DC | PRN
  Administered 2022-08-13: 140 mg via INTRAVENOUS

## 2022-08-13 MED ORDER — ONDANSETRON HCL 4 MG/2ML IJ SOLN: 4.0000 mg | Freq: Four times a day (QID) | INTRAMUSCULAR | Status: DC | PRN

## 2022-08-13 MED ORDER — CEFAZOLIN SODIUM-DEXTROSE 1-4 GM/50ML-% IV SOLN
1.0000 g | Freq: Three times a day (TID) | INTRAVENOUS | Status: AC
  Administered 2022-08-13 – 2022-08-14 (×2): 1 g via INTRAVENOUS
  Filled 2022-08-13 (×2): qty 50

## 2022-08-13 MED ORDER — SODIUM CHLORIDE 0.9 % IV SOLN
250.0000 mL | INTRAVENOUS | Status: DC
  Administered 2022-08-13: 250 mL via INTRAVENOUS

## 2022-08-13 MED ORDER — BISACODYL 10 MG RE SUPP: 10.0000 mg | Freq: Every day | RECTAL | Status: DC | PRN

## 2022-08-13 MED ORDER — PHENYLEPHRINE 80 MCG/ML (10ML) SYRINGE FOR IV PUSH (FOR BLOOD PRESSURE SUPPORT)
PREFILLED_SYRINGE | INTRAVENOUS | Status: AC
  Filled 2022-08-13: qty 10

## 2022-08-13 MED ORDER — ROSUVASTATIN CALCIUM 20 MG PO TABS
20.0000 mg | ORAL_TABLET | Freq: Every day | ORAL | Status: DC
  Administered 2022-08-14: 20 mg via ORAL
  Filled 2022-08-13: qty 1

## 2022-08-13 MED ORDER — 0.9 % SODIUM CHLORIDE (POUR BTL) OPTIME
TOPICAL | Status: DC | PRN
  Administered 2022-08-13: 1000 mL

## 2022-08-13 SURGICAL SUPPLY — 61 items
ADH SKN CLS APL DERMABOND .7 (GAUZE/BANDAGES/DRESSINGS) ×1
APL SKNCLS STERI-STRIP NONHPOA (GAUZE/BANDAGES/DRESSINGS) ×1
BAG COUNTER SPONGE SURGICOUNT (BAG) ×1 IMPLANT
BAG DECANTER FOR FLEXI CONT (MISCELLANEOUS) ×2 IMPLANT
BAG SPNG CNTER NS LX DISP (BAG) ×2
BENZOIN TINCTURE PRP APPL 2/3 (GAUZE/BANDAGES/DRESSINGS) ×2 IMPLANT
BLADE BONE MILL MEDIUM (MISCELLANEOUS) ×1 IMPLANT
BLADE CLIPPER SURG (BLADE) IMPLANT
BUR CUTTER 7.0 ROUND (BURR) IMPLANT
BUR MATCHSTICK NEURO 3.0 LAGG (BURR) ×1 IMPLANT
CANISTER SUCT 3000ML PPV (MISCELLANEOUS) ×1 IMPLANT
CAP LCK SPNE (Orthopedic Implant) ×6 IMPLANT
CAP LOCK SPINE RADIUS (Orthopedic Implant) IMPLANT
CAP LOCKING (Orthopedic Implant) ×6 IMPLANT
CNTNR URN SCR LID CUP LEK RST (MISCELLANEOUS) ×2 IMPLANT
CONT SPEC 4OZ STRL OR WHT (MISCELLANEOUS) ×1
COVER BACK TABLE 60X90IN (DRAPES) ×2 IMPLANT
DERMABOND ADVANCED .7 DNX12 (GAUZE/BANDAGES/DRESSINGS) ×1 IMPLANT
DRAPE C-ARM 42X72 X-RAY (DRAPES) ×2 IMPLANT
DRAPE HALF SHEET 40X57 (DRAPES) IMPLANT
DRAPE LAPAROTOMY 100X72X124 (DRAPES) ×1 IMPLANT
DRAPE SURG 17X23 STRL (DRAPES) ×8 IMPLANT
DRSG OPSITE POSTOP 4X6 (GAUZE/BANDAGES/DRESSINGS) ×1 IMPLANT
DRSG OPSITE POSTOP 4X8 (GAUZE/BANDAGES/DRESSINGS) IMPLANT
DURAPREP 26ML APPLICATOR (WOUND CARE) ×2 IMPLANT
ELECT REM PT RETURN 9FT ADLT (ELECTROSURGICAL) ×1
ELECTRODE REM PT RTRN 9FT ADLT (ELECTROSURGICAL) ×1 IMPLANT
EVACUATOR 1/8 PVC DRAIN (DRAIN) IMPLANT
GAUZE 4X4 16PLY ~~LOC~~+RFID DBL (SPONGE) IMPLANT
GAUZE SPONGE 4X4 12PLY STRL (GAUZE/BANDAGES/DRESSINGS) IMPLANT
GLOVE BIO SURGEON STRL SZ 6.5 (GLOVE) ×2 IMPLANT
GLOVE BIOGEL PI IND STRL 6.5 (GLOVE) ×2 IMPLANT
GLOVE ECLIPSE 9.0 STRL (GLOVE) ×2 IMPLANT
GOWN STRL REUS W/ TWL LRG LVL3 (GOWN DISPOSABLE) IMPLANT
GOWN STRL REUS W/ TWL XL LVL3 (GOWN DISPOSABLE) ×2 IMPLANT
GOWN STRL REUS W/TWL 2XL LVL3 (GOWN DISPOSABLE) IMPLANT
GOWN STRL REUS W/TWL LRG LVL3 (GOWN DISPOSABLE)
GOWN STRL REUS W/TWL XL LVL3 (GOWN DISPOSABLE) ×2
KIT BASIN OR (CUSTOM PROCEDURE TRAY) ×2 IMPLANT
KIT TURNOVER KIT B (KITS) ×2 IMPLANT
MILL MEDIUM DISP (BLADE) ×1 IMPLANT
NEEDLE HYPO 22GX1.5 SAFETY (NEEDLE) ×1 IMPLANT
NS IRRIG 1000ML POUR BTL (IV SOLUTION) ×2 IMPLANT
PACK LAMINECTOMY NEURO (CUSTOM PROCEDURE TRAY) ×1 IMPLANT
PUTTY GRAFTON DBF 6CC W/DELIVE (Putty) IMPLANT
ROD 70MM (Rod) ×2 IMPLANT
ROD SPNL 70X5.5 NS TI RDS (Rod) IMPLANT
SCREW 6.75X40MM (Screw) IMPLANT
SCREW 6.75X45MM (Screw) IMPLANT
SPACER PL CATALYFT LONG 11 (Spacer) IMPLANT
SPIKE FLUID TRANSFER (MISCELLANEOUS) ×1 IMPLANT
SPONGE SURGIFOAM ABS GEL 100 (HEMOSTASIS) ×2 IMPLANT
STRIP CLOSURE SKIN 1/2X4 (GAUZE/BANDAGES/DRESSINGS) ×2 IMPLANT
SUT VIC AB 0 CT1 18XCR BRD8 (SUTURE) ×4 IMPLANT
SUT VIC AB 0 CT1 8-18 (SUTURE) ×1
SUT VIC AB 2-0 CT1 18 (SUTURE) ×2 IMPLANT
SUT VIC AB 3-0 SH 8-18 (SUTURE) ×2 IMPLANT
TOWEL GREEN STERILE (TOWEL DISPOSABLE) ×2 IMPLANT
TOWEL GREEN STERILE FF (TOWEL DISPOSABLE) ×1 IMPLANT
TRAY FOLEY MTR SLVR 16FR STAT (SET/KITS/TRAYS/PACK) ×2 IMPLANT
WATER STERILE IRR 1000ML POUR (IV SOLUTION) ×1 IMPLANT

## 2022-08-13 NOTE — Brief Op Note (Signed)
08/13/2022  2:55 PM  PATIENT:  John Proctor  71 y.o. male  PRE-OPERATIVE DIAGNOSIS:  Spondylolisthesis  POST-OPERATIVE DIAGNOSIS:  Spondylolisthesis  PROCEDURE:  Procedure(s): Posterior Lumbar Interbody Fusion - Lumbar Four-Lumbar Five - Lumbar Five-Sacral One (N/A)  SURGEON:  Surgeon(s) and Role:    * Julio Sicks, MD - Primary  PHYSICIAN ASSISTANT:   ASSISTANTSMarland Mcalpine   ANESTHESIA:   general  EBL:  400 mL   BLOOD ADMINISTERED:none  DRAINS: none   LOCAL MEDICATIONS USED:  MARCAINE     SPECIMEN:  No Specimen  DISPOSITION OF SPECIMEN:  N/A  COUNTS:  YES  TOURNIQUET:  * No tourniquets in log *  DICTATION: .Dragon Dictation  PLAN OF CARE: Discharge to home after PACU  PATIENT DISPOSITION:  PACU - hemodynamically stable.   Delay start of Pharmacological VTE agent (>24hrs) due to surgical blood loss or risk of bleeding: yes

## 2022-08-13 NOTE — Transfer of Care (Signed)
Immediate Anesthesia Transfer of Care Note  Patient: John Proctor  Procedure(s) Performed: Posterior Lumbar Interbody Fusion - Lumbar Four-Lumbar Five - Lumbar Five-Sacral One (Back)  Patient Location: PACU  Anesthesia Type:General  Level of Consciousness: awake and patient cooperative  Airway & Oxygen Therapy: Patient Spontanous Breathing and Patient connected to face mask oxygen  Post-op Assessment: Report given to RN, Post -op Vital signs reviewed and stable, and Patient moving all extremities X 4  Post vital signs: Reviewed and stable  Last Vitals:  Vitals Value Taken Time  BP 119/92 08/13/22 1511  Temp    Pulse 80 08/13/22 1513  Resp 18 08/13/22 1513  SpO2 100 % 08/13/22 1513  Vitals shown include unvalidated device data.  Last Pain:  Vitals:   08/13/22 1001  TempSrc:   PainSc: 5       Patients Stated Pain Goal: 0 (08/13/22 1001)  Complications: No notable events documented.

## 2022-08-13 NOTE — Anesthesia Procedure Notes (Addendum)
Procedure Name: Intubation Date/Time: 08/13/2022 10:59 AM  Performed by: Heide Scales, CRNAPre-anesthesia Checklist: Patient identified, Emergency Drugs available, Suction available and Patient being monitored Patient Re-evaluated:Patient Re-evaluated prior to induction Oxygen Delivery Method: Circle system utilized Preoxygenation: Pre-oxygenation with 100% oxygen Induction Type: IV induction Ventilation: Mask ventilation without difficulty and Oral airway inserted - appropriate to patient size Laryngoscope Size: Mac and 4 Grade View: Grade I Tube type: Oral Tube size: 7.5 mm Number of attempts: 1 Airway Equipment and Method: Stylet and Oral airway Placement Confirmation: ETT inserted through vocal cords under direct vision, positive ETCO2 and breath sounds checked- equal and bilateral Secured at: 23 cm Tube secured with: Tape Dental Injury: Teeth and Oropharynx as per pre-operative assessment

## 2022-08-13 NOTE — Progress Notes (Signed)
Per Dr. Renold Don it is ok to proceed with today's scheduled surgery.

## 2022-08-13 NOTE — Op Note (Signed)
Date of procedure: 08/13/2022  Date of dictation: Same  Service: Neurosurgery  Preoperative diagnosis: L 4 5 stenosis with severe facet arthropathy and foraminal stenosis, L5-S1 lytic spondylolisthesis with foraminal and lateral recess stenosis  Postoperative diagnosis: Same  Procedure Name: Bilateral L4-5 and L5-S1 decompressive laminectomies and foraminotomies, more than would be required for simple interbody fusion alone.  L4-5, L5-S1 ponte osteotomies for sagittal plane restoration  L4-5, L5-S1 posterior lumbar interbody fusion utilizing interbody cages, local harvested autograft, and morselized allograft.  L4-5 S1 posterior lateral arthrodesis utilizing segmental pedicle screw fixation and local autografting.   Surgeon:Brielyn Bosak A.Asako Saliba, M.D.  Asst. Surgeon: Doran Durand, NP  Anesthesia: General  Indication: 71 year old male with severe back and bilateral lower extremity pain.  Work-up demonstrates evidence of a lytic spondylolisthesis at L5-S1 with significant foraminal stenosis.  Patient has marked adjacent segment facet arthropathy with severe lateral recess and foraminal stenosis at L4-5.  Patient presents now for two-level lumbar decompression and fusion in hopes of improving his symptoms.  Operative note: After induction of anesthesia, patient position prone onto Wilson frame and properly padded.  Lumbar region prepped and draped sterilely.  Incision made overlying L4-5 and S1.  Dissection performed bilaterally.  Retractor placed.  Fluoroscopy used.  Levels confirmed.  Decompressive laminectomy was then performed using Leksell rongeurs, Kerrison rongeurs and high-speed drill to remove the entire lamina of L4 and the entire lamina of L5 and the superior aspect lamina of S1.  Inferior facetectomies of L4 were performed bilaterally.  Inferior facetectomies at L5 were performed bilaterally completing a Gill procedure at this level.  Superior facetectomies at L5 and S1 were performed.   Complete facetectomies were performed including lateral releases in order to complete ponte osteotomies to allow for mobilization and restoration of sagittal plane balance.  Bilateral discectomies then performed at L4-5 and L5-S1.  Disc base was then prepared for interbody fusion.  With disc base distracted in the patient's right side.  Disc base was further cleaned and curetted.  A 11 mm Medtronic expandable cage was then impacted in the place and expanded at L4-5.  Distractor was removed patient's right side.  Disc base prepared on the right side.  Morselized autograft packed in the interspace.  A second cage was then impacted in place and expanded.  Procedures then repeated at L5-S1 in a similar fashion.  Pedicles of L4-L5 and S1 were then identified using surface landmarks and intraoperative fluoroscopy and superficial bone from the pedicle was then removed using high-speed drill.  Pedicle was then probed using a pedicle all each pedicle all track was then probed and found to be solidly within the bone.  6.75 mm radius brand screws from [employer] placed bilaterally at L4-L5 and S1.  Final images reveal good position of the cages and the hardware at the proper operative level with normal alignment of spine.  Each cage was then packed with demineralized bone fibers.  Gelfoam was placed over the laminectomy defect.  Short segment titanium rod placed over the screw heads at L4-L5 and S1.  Locking caps placed over the screws.  Locking caps then engaged with construct under compression.  Transverse processes and sacral ala were decorticated.  Morselized autograft was packed posterior laterally for later fusion.  Vancomycin powder was placed in deep wound space.  Wounds then closed in layers with Vicryl sutures.  Steri-Strips and sterile dressing were applied.  No apparent complications.  Patient tolerated the procedure well and he returns to the recovery room postop.

## 2022-08-13 NOTE — H&P (Signed)
John Proctor is an 71 y.o. male.   Chief Complaint: Back pain HPI: 71 year old male with progressive worsening lumbar pain with radiation to both lower extremities which is failed conservative management.  Work-up demonstrates evidence for chronic lytic spondylolisthesis, grade 1 at L5-S1 with foraminal stenosis.  Patient with adjacent level degeneration and severe stenosis at L4-5.  Patient presents now for L4-5 and L5-S1 decompression and fusion in hopes of improving his symptoms.  Past Medical History:  Diagnosis Date   Hypertension     Past Surgical History:  Procedure Laterality Date   ANKLE SURGERY Right    fusion    Family History  Problem Relation Age of Onset   Multiple sclerosis Mother    High blood pressure Father    Heart attack Brother    High blood pressure Brother    Social History:  reports that he quit smoking about 10 years ago. His smoking use included cigarettes. He has a 12.50 pack-year smoking history. He has never used smokeless tobacco. He reports current alcohol use of about 7.0 standard drinks of alcohol per week. He reports that he does not use drugs.  Allergies:  Allergies  Allergen Reactions   Gabapentin Nausea Only    Makes my head feel funny    Medications Prior to Admission  Medication Sig Dispense Refill   amLODipine (NORVASC) 10 MG tablet Take 10 mg by mouth daily.     aspirin EC 81 MG tablet Take 81 mg by mouth daily. Swallow whole.     lisinopril (ZESTRIL) 40 MG tablet Take 40 mg by mouth daily.     metoprolol succinate (TOPROL-XL) 25 MG 24 hr tablet Take 25 mg by mouth daily.     naproxen sodium (ALEVE) 220 MG tablet Take 220 mg by mouth 3 (three) times daily as needed (pain).     rosuvastatin (CRESTOR) 20 MG tablet Take 20 mg by mouth daily.     oxyCODONE-acetaminophen (PERCOCET/ROXICET) 5-325 MG tablet Take 1 tablet by mouth every 6 (six) hours as needed for severe pain. 120 tablet 0    No results found for this or any previous  visit (from the past 48 hour(s)). No results found.  Pertinent items noted in HPI and remainder of comprehensive ROS otherwise negative.  There were no vitals taken for this visit.  Patient is awake and alert.  He is oriented and appropriate.  Speech is fluent.  Judgment insight are intact.  Cranial nerve function normal bilateral.  Motor examination reveals intact motor strength bilaterally sensory examination with some patchy distal sensory loss in both lower extremities.  Reflexes are hypoactive but symmetric.  Achilles reflexes are completely absent.  Gait is antalgic.  Posture is normal peer examination head ears eyes nose and throat is unremarked.  Chest and abdomen are benign.  Extremities are free from injury or deformity. Assessment/Plan L4-5 severe facet arthropathy with severe multifactorial stenosis, L5-S1 lytic grade 1 spondylolisthesis with foraminal stenosis.  Plan bilateral L4-5 and L5-S1 decompressive laminotomies and foraminotomies followed by posterior lumbar MRI fusion utilizing interbody cages, local harvested autograft, and augmented with posterior LAD arthrodesis utilizing segmental pedicle screw fixation and local autografting.  Risks and benefits been explained.  Patient wishes to proceed.  John Proctor 08/13/2022, 9:54 AM

## 2022-08-13 NOTE — Progress Notes (Addendum)
Elon Jester, RN charge nurse in Short Stay talked to Erie Noe at Dr. Lindalou Hose office and was informed that medical clearance has not been received from Dr. Sherwood Gambler- patient's PCP.  Dr. Renold Don with anesthesia made aware and it is ok to proceed per Dr. Renold Don.  [id number]- Dr. Renold Don requested that staff reach out to Dr. Sharyon Medicus office for clearance. Charge nurse- M. Emelda Fear, RN is going to attempt to contact Dr. Sherwood Gambler.

## 2022-08-13 NOTE — Progress Notes (Signed)
Orthopedic Tech Progress Note Patient Details:  John Proctor [birth year] [id number]  Patient is in PACU, patient has brace   Patient ID: [name], male   DOB: May 01, 1951, 71 y.o.   MRN: [id number]  [name] 08/13/2022, 3:39 PM

## 2022-08-14 DIAGNOSIS — Z87891 Personal history of nicotine dependence: Secondary | ICD-10-CM | POA: Diagnosis not present

## 2022-08-14 DIAGNOSIS — M4317 Spondylolisthesis, lumbosacral region: Secondary | ICD-10-CM | POA: Diagnosis not present

## 2022-08-14 DIAGNOSIS — Z7982 Long term (current) use of aspirin: Secondary | ICD-10-CM | POA: Diagnosis not present

## 2022-08-14 DIAGNOSIS — M48061 Spinal stenosis, lumbar region without neurogenic claudication: Secondary | ICD-10-CM | POA: Diagnosis not present

## 2022-08-14 DIAGNOSIS — I10 Essential (primary) hypertension: Secondary | ICD-10-CM | POA: Diagnosis not present

## 2022-08-14 DIAGNOSIS — Z79899 Other long term (current) drug therapy: Secondary | ICD-10-CM | POA: Diagnosis not present

## 2022-08-14 MED ORDER — OXYCODONE HCL 10 MG PO TABS: 10.0000 mg | ORAL_TABLET | ORAL | 0 refills | Status: DC | PRN

## 2022-08-14 MED ORDER — METHOCARBAMOL 500 MG PO TABS: 500.0000 mg | ORAL_TABLET | Freq: Four times a day (QID) | ORAL | 0 refills | Status: DC

## 2022-08-14 NOTE — Anesthesia Postprocedure Evaluation (Signed)
Anesthesia Post Note  Patient: John Proctor  Procedure(s) Performed: Posterior Lumbar Interbody Fusion - Lumbar Four-Lumbar Five - Lumbar Five-Sacral One (Back)     Patient location during evaluation: PACU Anesthesia Type: General Level of consciousness: sedated and patient cooperative Pain management: pain level controlled Vital Signs Assessment: post-procedure vital signs reviewed and stable Respiratory status: spontaneous breathing Cardiovascular status: stable Anesthetic complications: no   No notable events documented.  Last Vitals:  Vitals:   08/14/22 0308 08/14/22 0723  BP: 118/66 (!) 106/59  Pulse: 79 82  Resp: 18 16  Temp: 36.8 C 36.9 C  SpO2: 99% 99%    Last Pain:  Vitals:   08/14/22 1100  TempSrc:   PainSc: 6                  Merrie Epler [employer]

## 2022-08-14 NOTE — Discharge Summary (Signed)
Physician Discharge Summary  Patient ID: John Proctor MRN: [id number] DOB/AGE: [birth year] 71 y.o.  Admit date: 08/13/2022 Discharge date: 08/14/2022  Admission Diagnoses:  Discharge Diagnoses:  Principal Problem:   Spondylolisthesis at L5-S1 level   Discharged Condition: good  Hospital Course: Patient admitted to the hospital where he underwent uncomplicated two-level lumbar decompression and fusion.  Postoperatively doing very well.  Preoperative back and lower extremity symptoms much improved.  He standing ambulating and voiding without difficulty.  Ready for discharge home.  Consults:   Significant Diagnostic Studies:   Treatments:   Discharge Exam: Blood pressure (!) 106/59, pulse 82, temperature 98.4 F (36.9 C), temperature source Oral, resp. rate 16, height 6\' 4"  (1.93 m), weight 79.4 kg, SpO2 99 %. Awake and alert.  Oriented and appropriate.  Motor and sensory function intact.  Wound clean and dry.  Chest and abdomen benign.  Disposition: Discharge disposition: 01-Home or Self Care        Allergies as of 08/14/2022       Reactions   Gabapentin Nausea Only   Makes my head feel funny        Medication List     TAKE these medications    amLODipine 10 MG tablet Commonly known as: NORVASC Take 10 mg by mouth daily.   aspirin EC 81 MG tablet Take 81 mg by mouth daily. Swallow whole.   lisinopril 40 MG tablet Commonly known as: ZESTRIL Take 40 mg by mouth daily.   methocarbamol 500 MG tablet Commonly known as: ROBAXIN Take 1 tablet (500 mg total) by mouth 4 (four) times daily.   metoprolol succinate 25 MG 24 hr tablet Commonly known as: TOPROL-XL Take 25 mg by mouth daily.   naproxen sodium 220 MG tablet Commonly known as: ALEVE Take 220 mg by mouth 3 (three) times daily as needed (pain).   Oxycodone HCl 10 MG Tabs Take 1 tablet (10 mg total) by mouth every 3 (three) hours as needed for severe pain ((score 7 to 10)).    oxyCODONE-acetaminophen 5-325 MG tablet Commonly known as: PERCOCET/ROXICET Take 1 tablet by mouth every 6 (six) hours as needed for severe pain.   rosuvastatin 20 MG tablet Commonly known as: CRESTOR Take 20 mg by mouth daily.               Durable Medical Equipment  (From admission, onward)           Start     Ordered   08/13/22 1728  DME Walker rolling  Once       Question:  Patient needs a walker to treat with the following condition  Answer:  Spondylolisthesis at L5-S1 level   08/13/22 1727   08/13/22 1728  DME 3 n 1  Once        08/13/22 1727             Signed: 08/15/22 A Sparrow Sanzo 08/14/2022, 10:27 AM

## 2022-08-14 NOTE — Evaluation (Signed)
Occupational Therapy Evaluation Patient Details Name: John Proctor MRN: [id number] DOB: [birth year] Today's Date: 08/14/2022   History of Present Illness 71 y/o male who presents s/p L4-S1 PLIF on 08/13/2022. PMH significant for CKD, HTN, R ankle fusion, L eye surgery.   Clinical Impression   Patient is s/p L4-s1 PLIF surgery resulting in functional limitations due to the deficits listed below (see OT problem list). Pt currently demonstrates balance deficits with standing in RW and showering balance. Pt advised sitting for showering would be safest. Pt could benefit from RW for balance and pain management with basic transfers. Pt utilized AE for dressing and provided handout with details if pt plans to purchase.  Patient will benefit from skilled OT acutely to increase independence and safety with ADLS to allow discharge home.       Recommendations for follow up therapy are one component of a multi-disciplinary discharge planning process, led by the attending physician.  Recommendations may be updated based on patient status, additional functional criteria and insurance authorization.   Follow Up Recommendations  No OT follow up     Assistance Recommended at Discharge None  Patient can return home with the following A little help with bathing/dressing/bathroom;A little help with walking and/or transfers;Assist for transportation    Functional Status Assessment  Patient has had a recent decline in their functional status and demonstrates the ability to make significant improvements in function in a reasonable and predictable amount of time.  Equipment Recommendations  Other (comment) (RW)    Recommendations for Other Services       Precautions / Restrictions Precautions Precautions: Fall;Back Precaution Booklet Issued: Yes (comment) (OT provided) Precaution Comments: Reviewed precautions with handout provided Required Braces or Orthoses: Spinal Brace Spinal Brace: Lumbar  corset;Applied in sitting position Restrictions Weight Bearing Restrictions: No      Mobility Bed Mobility               General bed mobility comments: able to power up from elevate surface to stand    Transfers Overall transfer level: Needs assistance Equipment used: Rolling walker (2 wheels) Transfers: Sit to/from Stand Sit to Stand: Supervision           General transfer comment: cues for position in RW      Balance Overall balance assessment: Needs assistance Sitting-balance support: Feet supported, No upper extremity supported Sitting balance-Leahy Scale: Fair     Standing balance support: No upper extremity supported, During functional activity, Reliant on assistive device for balance Standing balance-Leahy Scale: Fair Standing balance comment: pt noted to have a sway and educated that its advised to sit for showering and dressing due to fall risk at this time                           ADL either performed or assessed with clinical judgement   ADL Overall ADL's : Needs assistance/impaired Eating/Feeding: Independent   Grooming: Wash/dry hands           Upper Body Dressing : Modified independent Upper Body Dressing Details (indicate cue type and reason): don doff brace / dressed shirt Lower Body Dressing: Modified independent;With adaptive equipment;Adhering to back precautions;Sit to/from stand Lower Body Dressing Details (indicate cue type and reason): using reacher shoe horn and sock aide. Toilet Transfer: Supervision/safety;Ambulation;Rolling walker (2 wheels)     Toileting - Clothing Manipulation Details (indicate cue type and reason): pt reports not concerns for hygiene and education provided just so pt  has knowledge if needed   Tub/Shower Transfer Details (indicate cue type and reason): has walk in shower Functional mobility during ADLs: Rolling walker (2 wheels)       Vision Baseline Vision/History: 1 Wears glasses Ability to  See in Adequate Light: 0 Adequate Patient Visual Report: No change from baseline       Perception     Praxis      Pertinent Vitals/Pain Pain Assessment Pain Assessment: 0-10 Pain Score: 3  Pain Location: Incision site Pain Descriptors / Indicators: Operative site guarding, Sore Pain Intervention(s): Monitored during session, Premedicated before session, Repositioned, Limited activity within patient's tolerance     Hand Dominance Right   Extremity/Trunk Assessment Upper Extremity Assessment Upper Extremity Assessment: Overall WFL for tasks assessed   Lower Extremity Assessment Lower Extremity Assessment: Defer to PT evaluation RLE Deficits / Details: Decreased strength and muscular endurance consistent with pre-op diagnosis. Pt reports R side was worse than L prior to surgery but now feels equal. Noted antaglia consistent with weak glute med R>L.   Cervical / Trunk Assessment Cervical / Trunk Assessment: Back Surgery   Communication Communication Communication: No difficulties   Cognition Arousal/Alertness: Awake/alert Behavior During Therapy: WFL for tasks assessed/performed Overall Cognitive Status: Within Functional Limits for tasks assessed                                       General Comments  incision dry and intact at this time    Exercises     Shoulder Instructions      Home Living Family/patient expects to be discharged to:: Private residence Living Arrangements: Spouse/significant other;Children Available Help at Discharge: Family;Available 24 hours/day Type of Home: House Home Access: Stairs to enter [employer] of Steps: 3 Entrance Stairs-Rails: Right Home Layout: One level     Bathroom Shower/Tub: Tub/shower unit;Walk-in shower   Bathroom Toilet: Standard     Home Equipment: Cane - single point   Additional Comments: dog named millie      Prior Functioning/Environment Prior Level of Function :  Independent/Modified Independent             Mobility Comments: Occasionally uses SPC when going out in the community. No AD at home.          OT Problem List: Decreased strength;Decreased activity tolerance;Impaired balance (sitting and/or standing);Decreased safety awareness;Decreased knowledge of use of DME or AE;Decreased knowledge of precautions      OT Treatment/Interventions: Self-care/ADL training;Therapeutic exercise;Energy conservation;DME and/or AE instruction;Manual therapy;Therapeutic activities;Patient/family education;Balance training    OT Goals(Current goals can be found in the care plan section) Acute Rehab OT Goals Patient Stated Goal: to be able to go home today OT Goal Formulation: With patient Time For Goal Achievement: 08/28/22 Potential to Achieve Goals: Good  OT Frequency: Min 2X/week    Co-evaluation              AM-PAC OT "6 Clicks" Daily Activity     Outcome Measure Help from another person eating meals?: None Help from another person taking care of personal grooming?: None Help from another person toileting, which includes using toliet, bedpan, or urinal?: A Little Help from another person bathing (including washing, rinsing, drying)?: A Little Help from another person to put on and taking off regular upper body clothing?: None Help from another person to put on and taking off regular lower body clothing?: A Little 6 Click Score: 21  End of Session Equipment Utilized During Treatment: Rolling walker (2 wheels);Back brace Nurse Communication: Mobility status;Precautions  Activity Tolerance: Patient tolerated treatment well Patient left: in chair;with call bell/phone within reach  OT Visit Diagnosis: Unsteadiness on feet (R26.81);Muscle weakness (generalized) (M62.81)                Time: 1287-8676 OT Time Calculation (min): 21 min Charges:  OT General Charges $OT Visit: 1 Visit OT Evaluation $OT Eval Moderate Complexity: 1  Mod   Brynn, OTR/L  Acute Rehabilitation Services Office: [phone] .   Mateo Flow 08/14/2022, 9:08 AM

## 2022-08-14 NOTE — Evaluation (Signed)
Physical Therapy Evaluation  Patient Details Name: John Proctor MRN: [id number] DOB: 02-Nov-1950 Today's Date: 08/14/2022  History of Present Illness  Pt is a 71 y/o male who presents s/p L4-S1 PLIF on 08/13/2022. PMH significant for CKD, HTN, R ankle fusion, L eye surgery.   Clinical Impression  Pt admitted with above diagnosis. At the time of PT eval, pt was able to demonstrate transfers with supervision and progressed ambulation from min assist to min guard assist and RW for support. Pt was educated on precautions, brace application/wearing schedule, appropriate activity progression, and car transfer. Pt currently with functional limitations due to the deficits listed below (see PT Problem List). Pt will benefit from skilled PT to increase their independence and safety with mobility to allow discharge to the venue listed below.         Recommendations for follow up therapy are one component of a multi-disciplinary discharge planning process, led by the attending physician.  Recommendations may be updated based on patient status, additional functional criteria and insurance authorization.  Follow Up Recommendations No PT follow up      Assistance Recommended at Discharge PRN  Patient can return home with the following  A little help with walking and/or transfers;A little help with bathing/dressing/bathroom;Assistance with cooking/housework;Assist for transportation;Help with stairs or ramp for entrance    Equipment Recommendations Rolling walker (2 wheels)  Recommendations for Other Services       Functional Status Assessment Patient has had a recent decline in their functional status and demonstrates the ability to make significant improvements in function in a reasonable and predictable amount of time.     Precautions / Restrictions Precautions Precautions: Fall;Back Precaution Booklet Issued: Yes (comment) (OT provided) Precaution Comments: Reviewed precautions verbally  during functional mobility. Required Braces or Orthoses: Spinal Brace Spinal Brace: Lumbar corset;Applied in sitting position Restrictions Weight Bearing Restrictions: No      Mobility  Bed Mobility Overal bed mobility: Needs Assistance Bed Mobility: Rolling, Sidelying to Sit Rolling: Modified independent (Device/Increase time) Sidelying to sit: Supervision       General bed mobility comments: Pt did not require assist throughout transition to EOB, however supervision and step-by-step cues for optimal log roll technique. HOB flat and rails lowered to simulate home environment.    Transfers Overall transfer level: Needs assistance Equipment used: Rolling walker (2 wheels) Transfers: Sit to/from Stand Sit to Stand: Supervision           General transfer comment: VC's for improved posture and hand placement on seated surface for safety. Pt benefits from wider BOS to minimize trunk flexion.    Ambulation/Gait Ambulation/Gait assistance: Min assist, Min guard Gait Distance (Feet): 400 Feet Assistive device: Rolling walker (2 wheels) Gait Pattern/deviations: Step-through pattern, Decreased stride length, Trunk flexed, Narrow base of support, Decreased dorsiflexion - right, Decreased dorsiflexion - left, Antalgic Gait velocity: Decreased Gait velocity interpretation: <1.31 ft/sec, indicative of household ambulator   General Gait Details: Antalgic but able to improve by end of gait training. Pt unsteady and with narrow BOS.  Stairs Stairs: Yes Stairs assistance: Min guard Stair Management: One rail Right, Sideways Number of Stairs: 4 General stair comments: VC's for sequencing and general safety.  Wheelchair Mobility    Modified Rankin (Stroke Patients Only)       Balance Overall balance assessment: Needs assistance Sitting-balance support: Feet supported, No upper extremity supported Sitting balance-Leahy Scale: Fair     Standing balance support: No upper  extremity supported, During functional activity, Reliant on  assistive device for balance Standing balance-Leahy Scale: Poor                               Pertinent Vitals/Pain Pain Assessment Pain Assessment: 0-10 Pain Score: 3  Pain Location: Incision site Pain Descriptors / Indicators: Operative site guarding, Sore Pain Intervention(s): Limited activity within patient's tolerance, Monitored during session, Repositioned    Home Living Family/patient expects to be discharged to:: Private residence Living Arrangements: Spouse/significant other;Children Available Help at Discharge: Family;Available 24 hours/day Type of Home: House Home Access: Stairs to enter Entrance Stairs-Rails: Right Entrance Stairs-Number of Steps: 3   Home Layout: One level Home Equipment: Cane - single point      Prior Function Prior Level of Function : Independent/Modified Independent             Mobility Comments: Occasionally uses SPC when going out in the community. No AD at home.       Hand Dominance        Extremity/Trunk Assessment   Upper Extremity Assessment Upper Extremity Assessment: Overall WFL for tasks assessed    Lower Extremity Assessment Lower Extremity Assessment: RLE deficits/detail;LLE deficits/detail RLE Deficits / Details: Decreased strength and muscular endurance consistent with pre-op diagnosis. Pt reports R side was worse than L prior to surgery but now feels equal. Noted antaglia consistent with weak glute med R>L.    Cervical / Trunk Assessment Cervical / Trunk Assessment: Back Surgery  Communication   Communication: No difficulties  Cognition Arousal/Alertness: Awake/alert Behavior During Therapy: WFL for tasks assessed/performed Overall Cognitive Status: Within Functional Limits for tasks assessed                                          General Comments      Exercises     Assessment/Plan    PT Assessment Patient needs  continued PT services  PT Problem List Decreased strength;Decreased balance;Decreased activity tolerance;Decreased mobility;Decreased knowledge of use of DME;Decreased safety awareness;Decreased knowledge of precautions;Pain       PT Treatment Interventions DME instruction;Gait training;Functional mobility training;Therapeutic activities;Therapeutic exercise;Balance training;Patient/family education;Stair training    PT Goals (Current goals can be found in the Care Plan section)  Acute Rehab PT Goals Patient Stated Goal: Home today PT Goal Formulation: With patient Time For Goal Achievement: 08/21/22 Potential to Achieve Goals: Good    Frequency Min 5X/week     Co-evaluation               AM-PAC PT "6 Clicks" Mobility  Outcome Measure Help needed turning from your back to your side while in a flat bed without using bedrails?: A Little Help needed moving from lying on your back to sitting on the side of a flat bed without using bedrails?: A Little Help needed moving to and from a bed to a chair (including a wheelchair)?: A Little Help needed standing up from a chair using your arms (e.g., wheelchair or bedside chair)?: A Little Help needed to walk in hospital room?: A Little Help needed climbing 3-5 steps with a railing? : A Little 6 Click Score: 18    End of Session Equipment Utilized During Treatment: Gait belt;Back brace Activity Tolerance: Patient tolerated treatment well Patient left: Other (comment) (EOB with OT present) Nurse Communication: Mobility status PT Visit Diagnosis: Unsteadiness on feet (R26.81);Pain Pain - part  of body:  (incision site - back)    Time: [id number] PT Time Calculation (min) (ACUTE ONLY): 23 min   Charges:   PT Evaluation $PT Eval Low Complexity: 1 Low PT Treatments $Gait Training: 8-22 mins        Rolinda Roan, PT, DPT Acute Rehabilitation Services Secure Chat Preferred Office: [phone]   Thelma Comp 08/14/2022,  8:53 AM

## 2022-08-14 NOTE — Discharge Instructions (Signed)

## 2022-08-14 NOTE — Plan of Care (Signed)
Pt doing well. Pt given D/C instructions with verbal understanding. Rx's were sent to the pharmacy by MD. Pt's incision is clean and dry with no sign of infection. Pt's IV was removed prior to D/C. Pt D/C'd home via wheelchair per MD order. Pt is stable @ D/C and has no other needs at this time. Pt is stable @ D/C and has no other needs at this time. Rema Fendt, RN

## 2022-08-20 MED FILL — Sodium Chloride IV Soln 0.9%: INTRAVENOUS | Qty: 1000 | Status: AC

## 2022-08-20 MED FILL — Heparin Sodium (Porcine) Inj 1000 Unit/ML: INTRAMUSCULAR | Qty: 30 | Status: AC

## 2022-09-11 ENCOUNTER — Telehealth: Payer: Self-pay | Admitting: Orthopaedic Surgery

## 2022-09-11 NOTE — Telephone Encounter (Signed)
Patient called requesting a refill on his pain medicine.  08/13/22 patient had surgery.  He has already seen him one time and he was suppose to see him again on 09/19/22.  He said he is suppose to see Dr. Hilda Lias next week.   I advised him since he has had surgery with Dr. Jordan Likes, he will need to contact their  office to request refill on his pain medicine.    He said ok

## 2022-09-18 ENCOUNTER — Ambulatory Visit: Payer: Medicare HMO | Admitting: Orthopaedic Surgery

## 2022-09-18 ENCOUNTER — Encounter: Payer: Self-pay | Admitting: Orthopaedic Surgery

## 2022-09-18 VITALS — BP 166/93 | HR 80

## 2022-09-18 DIAGNOSIS — M5441 Lumbago with sciatica, right side: Secondary | ICD-10-CM | POA: Diagnosis not present

## 2022-09-18 DIAGNOSIS — G8929 Other chronic pain: Secondary | ICD-10-CM | POA: Diagnosis not present

## 2022-09-18 NOTE — Patient Instructions (Signed)
As the weather changes and gets cooler, you may notice you are affected more. You may have more pain in your joints. This is normal. Dress warmly and make sure that area is covered well.   

## 2022-09-18 NOTE — Progress Notes (Signed)
He had lower back surgery by neurosurgery recently.  He is feeling better.  He has a lower back brace and a cane.  He says he is feeling better and has no paresthesias or the dull deep pain that he had before.  He has no new trauma.  He has back brace on.  NV intact.  Motion is good with brace and muscle tone and strength normal.  Encounter Diagnosis  Name Primary?   Chronic right-sided low back pain with right-sided sciatica Yes   I will let the neurosurgeon decide if he wants to give pain medicine or have me give.  Return in six weeks.  Call if any problem.  Precautions discussed.  Electronically Signed Darreld Mclean, MD 12/19/20238:08 AM

## 2022-09-19 DIAGNOSIS — M4310 Spondylolisthesis, site unspecified: Secondary | ICD-10-CM | POA: Diagnosis not present

## 2022-09-28 DIAGNOSIS — R300 Dysuria: Secondary | ICD-10-CM | POA: Diagnosis not present

## 2022-09-28 DIAGNOSIS — R39198 Other difficulties with micturition: Secondary | ICD-10-CM | POA: Diagnosis not present

## 2022-09-28 DIAGNOSIS — N419 Inflammatory disease of prostate, unspecified: Secondary | ICD-10-CM | POA: Diagnosis not present

## 2022-10-18 DIAGNOSIS — R42 Dizziness and giddiness: Secondary | ICD-10-CM | POA: Diagnosis not present

## 2022-10-18 DIAGNOSIS — I10 Essential (primary) hypertension: Secondary | ICD-10-CM | POA: Diagnosis not present

## 2022-10-18 DIAGNOSIS — R269 Unspecified abnormalities of gait and mobility: Secondary | ICD-10-CM | POA: Diagnosis not present

## 2022-10-18 DIAGNOSIS — R5383 Other fatigue: Secondary | ICD-10-CM | POA: Diagnosis not present

## 2022-10-18 DIAGNOSIS — Z6822 Body mass index (BMI) 22.0-22.9, adult: Secondary | ICD-10-CM | POA: Diagnosis not present

## 2022-10-18 DIAGNOSIS — Z9229 Personal history of other drug therapy: Secondary | ICD-10-CM | POA: Diagnosis not present

## 2022-10-18 DIAGNOSIS — A6922 Other neurologic disorders in Lyme disease: Secondary | ICD-10-CM | POA: Diagnosis not present

## 2022-10-18 DIAGNOSIS — D649 Anemia, unspecified: Secondary | ICD-10-CM | POA: Diagnosis not present

## 2022-10-18 DIAGNOSIS — M2550 Pain in unspecified joint: Secondary | ICD-10-CM | POA: Diagnosis not present

## 2022-10-18 DIAGNOSIS — E559 Vitamin D deficiency, unspecified: Secondary | ICD-10-CM | POA: Diagnosis not present

## 2022-10-24 DIAGNOSIS — M4310 Spondylolisthesis, site unspecified: Secondary | ICD-10-CM | POA: Diagnosis not present

## 2022-10-29 ENCOUNTER — Other Ambulatory Visit (HOSPITAL_COMMUNITY): Payer: Self-pay | Admitting: Internal Medicine

## 2022-10-29 DIAGNOSIS — H8110 Benign paroxysmal vertigo, unspecified ear: Secondary | ICD-10-CM

## 2022-10-29 DIAGNOSIS — R269 Unspecified abnormalities of gait and mobility: Secondary | ICD-10-CM

## 2022-10-30 ENCOUNTER — Ambulatory Visit: Payer: Medicare HMO | Admitting: Orthopaedic Surgery

## 2022-11-29 ENCOUNTER — Encounter: Payer: Self-pay | Admitting: Radiology

## 2022-12-20 DIAGNOSIS — M4310 Spondylolisthesis, site unspecified: Secondary | ICD-10-CM | POA: Diagnosis not present

## 2023-01-23 DIAGNOSIS — E559 Vitamin D deficiency, unspecified: Secondary | ICD-10-CM | POA: Diagnosis not present

## 2023-01-23 DIAGNOSIS — G9332 Myalgic encephalomyelitis/chronic fatigue syndrome: Secondary | ICD-10-CM | POA: Diagnosis not present

## 2023-01-23 DIAGNOSIS — G894 Chronic pain syndrome: Secondary | ICD-10-CM | POA: Diagnosis not present

## 2023-01-23 DIAGNOSIS — M2550 Pain in unspecified joint: Secondary | ICD-10-CM | POA: Diagnosis not present

## 2023-01-23 DIAGNOSIS — D518 Other vitamin B12 deficiency anemias: Secondary | ICD-10-CM | POA: Diagnosis not present

## 2023-01-23 DIAGNOSIS — Z0001 Encounter for general adult medical examination with abnormal findings: Secondary | ICD-10-CM | POA: Diagnosis not present

## 2023-01-23 DIAGNOSIS — I10 Essential (primary) hypertension: Secondary | ICD-10-CM | POA: Diagnosis not present

## 2023-01-23 DIAGNOSIS — E782 Mixed hyperlipidemia: Secondary | ICD-10-CM | POA: Diagnosis not present

## 2023-01-23 DIAGNOSIS — A6922 Other neurologic disorders in Lyme disease: Secondary | ICD-10-CM | POA: Diagnosis not present

## 2023-01-23 DIAGNOSIS — Z1331 Encounter for screening for depression: Secondary | ICD-10-CM | POA: Diagnosis not present

## 2023-01-23 DIAGNOSIS — Z9229 Personal history of other drug therapy: Secondary | ICD-10-CM | POA: Diagnosis not present

## 2023-01-23 DIAGNOSIS — Z6821 Body mass index (BMI) 21.0-21.9, adult: Secondary | ICD-10-CM | POA: Diagnosis not present

## 2023-04-29 DIAGNOSIS — H524 Presbyopia: Secondary | ICD-10-CM | POA: Diagnosis not present

## 2023-05-16 DIAGNOSIS — M4310 Spondylolisthesis, site unspecified: Secondary | ICD-10-CM | POA: Diagnosis not present

## 2023-05-28 DIAGNOSIS — E782 Mixed hyperlipidemia: Secondary | ICD-10-CM | POA: Diagnosis not present

## 2023-05-28 DIAGNOSIS — Z6820 Body mass index (BMI) 20.0-20.9, adult: Secondary | ICD-10-CM | POA: Diagnosis not present

## 2023-05-28 DIAGNOSIS — G7000 Myasthenia gravis without (acute) exacerbation: Secondary | ICD-10-CM | POA: Diagnosis not present

## 2023-05-28 DIAGNOSIS — M6281 Muscle weakness (generalized): Secondary | ICD-10-CM | POA: Diagnosis not present

## 2023-05-28 DIAGNOSIS — A6922 Other neurologic disorders in Lyme disease: Secondary | ICD-10-CM | POA: Diagnosis not present

## 2023-05-28 DIAGNOSIS — Z9229 Personal history of other drug therapy: Secondary | ICD-10-CM | POA: Diagnosis not present

## 2023-05-28 DIAGNOSIS — I10 Essential (primary) hypertension: Secondary | ICD-10-CM | POA: Diagnosis not present

## 2023-05-28 DIAGNOSIS — H532 Diplopia: Secondary | ICD-10-CM | POA: Diagnosis not present

## 2023-05-29 ENCOUNTER — Other Ambulatory Visit (HOSPITAL_COMMUNITY): Payer: Self-pay | Admitting: Internal Medicine

## 2023-05-29 DIAGNOSIS — G7000 Myasthenia gravis without (acute) exacerbation: Secondary | ICD-10-CM

## 2023-06-07 ENCOUNTER — Ambulatory Visit (HOSPITAL_COMMUNITY)
Admission: RE | Admit: 2023-06-07 | Discharge: 2023-06-07 | Disposition: A | Discharge status: Home / Self Care | Payer: Medicare HMO | Source: Ambulatory Visit | Attending: Internal Medicine | Admitting: Internal Medicine

## 2023-06-07 DIAGNOSIS — I700 Atherosclerosis of aorta: Secondary | ICD-10-CM | POA: Diagnosis not present

## 2023-06-07 DIAGNOSIS — G7000 Myasthenia gravis without (acute) exacerbation: Secondary | ICD-10-CM | POA: Diagnosis not present

## 2023-06-27 ENCOUNTER — Encounter (INDEPENDENT_AMBULATORY_CARE_PROVIDER_SITE_OTHER): Payer: Self-pay | Admitting: *Deleted

## 2023-07-01 ENCOUNTER — Ambulatory Visit (INDEPENDENT_AMBULATORY_CARE_PROVIDER_SITE_OTHER): Payer: Medicare HMO | Admitting: Gastroenterology

## 2023-07-01 ENCOUNTER — Encounter (INDEPENDENT_AMBULATORY_CARE_PROVIDER_SITE_OTHER): Payer: Self-pay

## 2023-07-01 ENCOUNTER — Encounter (INDEPENDENT_AMBULATORY_CARE_PROVIDER_SITE_OTHER): Payer: Self-pay | Admitting: Gastroenterology

## 2023-07-01 VITALS — BP 143/83 | HR 57 | Temp 97.8°F | Ht 76.0 in | Wt 165.8 lb

## 2023-07-01 DIAGNOSIS — Z1211 Encounter for screening for malignant neoplasm of colon: Secondary | ICD-10-CM | POA: Insufficient documentation

## 2023-07-01 DIAGNOSIS — K862 Cyst of pancreas: Secondary | ICD-10-CM | POA: Insufficient documentation

## 2023-07-01 DIAGNOSIS — I10 Essential (primary) hypertension: Secondary | ICD-10-CM | POA: Diagnosis not present

## 2023-07-01 DIAGNOSIS — R1319 Other dysphagia: Secondary | ICD-10-CM | POA: Diagnosis not present

## 2023-07-01 NOTE — Progress Notes (Signed)
Vista Lawman , M.D. Gastroenterology & Hepatology [facility] Gastroenterology [address] [place], [place] [address] Primary Care Physician: Elfredia Nevins, MD [address] [place] [address]  Chief Complaint:  Pancreatic lesion , Dysphagia , CRC Screening   History of Present Illness: ABDIAZIZ KLAHN is a 72 y.o. male with hTN, HLD , OSA on NSAIDs who presents for evaluation of Pancreatic lesion , Dysphagia and CRC Screening .  Patient reports that he had CT chest and for workup of possible myasthenia gravis and it picked up an incidental pancreatic lesion.  Patient is an ex-smoker who over 15 years ago.  He has been losing some weight baseline weight is 190 today is 165 and patient attributes it to decrease in appetite since the surgery last year for back fusion.  Patient denies any abdominal pain or jaundice.  No family history of any pancreatic cancer.  No history of chronic idiopathic pancreatitis  Patient reports that for past few months he would have choking spell on solid and liquid food and with diplopia there was concern for myasthenia gravis for workup has been negative so far. The patient denies having any nausea, vomiting, fever, chills, hematochezia, melena, hematemesis, abdominal distention, abdominal pain, diarrhea, jaundice, pruritus or weight loss.  Last XBM:WUXL Last Colonoscopy:Over 20 years ago   Patient is 05/2023 hemoglobin 12.7 platelet 245 normal liver enzymes FHx: neg for any gastrointestinal/liver disease, no malignancies Social: Ek smoker -20 years ago , alcohol or illicit drug use Surgical: Lower back fusion  Past Medical History: Past Medical History:  Diagnosis Date   Chronic kidney disease    Hypertension     Past Surgical History: Past Surgical History:  Procedure Laterality Date   ANKLE SURGERY Right    fusion   COLONOSCOPY     EYE SURGERY Left     Family History: Family History  Problem  Relation Age of Onset   Multiple sclerosis Mother    High blood pressure Father    Heart attack Brother    High blood pressure Brother     Social History: Social History   Tobacco Use  Smoking Status Former   Current packs/day: 0.00   Average packs/day: 0.5 packs/day for 25.0 years (12.5 ttl pk-yrs)   Types: Cigarettes   Start date: 08/11/1987   Quit date: 08/10/2012   Years since quitting: 10.8  Smokeless Tobacco Never   Social History   Substance and Sexual Activity  Alcohol Use Yes   Alcohol/week: 7.0 standard drinks of alcohol   Types: 7 Cans of beer per week   Comment: maybe 1 beer per day   Social History   Substance and Sexual Activity  Drug Use Never    Allergies: Allergies  Allergen Reactions   Gabapentin Nausea Only    Makes my head feel funny    Medications: Current Outpatient Medications  Medication Sig Dispense Refill   amLODipine (NORVASC) 10 MG tablet Take 10 mg by mouth daily.     aspirin EC 81 MG tablet Take 81 mg by mouth daily. Swallow whole.     HYDROcodone-acetaminophen (NORCO) 10-325 MG tablet Take 1 tablet by mouth every 6 (six) hours as needed.     lisinopril (ZESTRIL) 40 MG tablet Take 40 mg by mouth daily.     metoprolol succinate (TOPROL-XL) 25 MG 24 hr tablet Take 25 mg by mouth daily.     naproxen sodium (ALEVE) 220 MG tablet Take 220 mg by mouth 3 (three) times  daily as needed (pain).     rosuvastatin (CRESTOR) 20 MG tablet Take 20 mg by mouth daily.     No current facility-administered medications for this visit.    Review of Systems: GENERAL: negative for malaise, night sweats HEENT: No changes in hearing or vision, no nose bleeds or other nasal problems. NECK: Negative for lumps, goiter, pain and significant neck swelling RESPIRATORY: Negative for cough, wheezing CARDIOVASCULAR: Negative for chest pain, leg swelling, palpitations, orthopnea GI: SEE HPI MUSCULOSKELETAL: Negative for joint pain or swelling, back pain, and  muscle pain. SKIN: Negative for lesions, rash HEMATOLOGY Negative for prolonged bleeding, bruising easily, and swollen nodes. ENDOCRINE: Negative for cold or heat intolerance, polyuria, polydipsia and goiter. NEURO: negative for tremor, gait imbalance, syncope and seizures. The remainder of the review of systems is noncontributory.   Physical Exam: BP (!) 143/83 (BP Location: Right Arm, Patient Position: Sitting, Cuff Size: Normal)   Pulse (!) 57   Temp 97.8 F (36.6 C) (Temporal)   Ht 6\' 4"  (1.93 m)   Wt 165 lb 12.8 oz (75.2 kg)   BMI 20.18 kg/m  GENERAL: The patient is AO x3, in no acute distress. HEENT: Head is normocephalic and atraumatic. EOMI are intact. Mouth is well hydrated and without lesions. NECK: Supple. No masses LUNGS: Clear to auscultation. No presence of rhonchi/wheezing/rales. Adequate chest expansion HEART: RRR, normal s1 and s2. ABDOMEN: Soft, nontender, no guarding, no peritoneal signs, and nondistended. BS +. No masses. EXTREMITIES: Without any cyanosis, clubbing, rash, lesions or edema. NEUROLOGIC: AOx3, no focal motor deficit. SKIN: no jaundice, no rashes   Imaging/Labs: as above     Latest Ref Rng & Units 08/01/2022    9:53 AM 05/16/2016   11:25 PM  CBC  WBC 4.0 - 10.5 K/uL 6.8  7.3   Hemoglobin 13.0 - 17.0 g/dL 16.1  09.6   Hematocrit 39.0 - 52.0 % 43.1  43.5   Platelets 150 - 400 K/uL 244  237    No results found for: "IRON", "TIBC", "FERRITIN"  I personally reviewed and interpreted the available labs, imaging and endoscopic files. CT chest   . No evidence for a mediastinal mass or thymoma. 2. No acute chest abnormality. 3. 1.6 cm low-density structure near the pancreatic tail. This could represent an exophytic pancreatic cystic lesion. Recommend follow up pre and post contrast MRI/MRCP or pancreatic protocol CT in 1 year. This recommendation follows ACR consensus guidelines: Management of Incidental Pancreatic Cysts: A White Paper of the  ACR Incidental Findings Committee. J Am Coll Radiol 2017;14:911-923. 4. 3 mm nodule along the right minor fissure is likely an incidental  Impression and Plan:  GRACEN SOUTHWELL is a 72 y.o. male with hTN, HLD , OSA on NSAIDs who presents for evaluation of Pancreatic lesion , Dysphagia and CRC Screening .  #Pancreatic lesion  This could be pancreatic cyst versus IPMN(intra papillary mucinous neoplasm).  Pancreatic cyst not considered benign and requires further surveillance and workup as they do have potential to become malignant  Given patient had mild fusion with instrumentation and he was told that the proceedings are not MRI compatible we will be able to get MRI to evaluate the cyst  Will obtain CT abdomen pelvis with contrast pancreatic protocol with fine thin slices to evaluate the nature of the cyst, pancreatic duct patency and if there is any connection with the pancreatic ducts  #Dysphagia  Patient has choking spell with solid and liquid food, is undergoing evaluation for myasthenia gravis  but workup has been negative so far  He did had some unintentional weight loss baseline weight of 190 today 165 pounds attributes it to surgery  Given dysphagia and unintentional weight loss elevation above the age of 67 this is considered alarm symptoms and advise diagnostic upper endoscopy to rule out stricture, web, ring and malignancy  HCM:  Patient last colon cancer screening colonoscopy was over 20 years ago.  Will also proceed with colorectal cancer screening colonoscopy at the same time as upper endoscopy  #Hypertension The patient was found to have elevated blood pressure when vital signs were checked in the office. The blood pressure was rechecked by the nursing staff and it was found be persistently elevated >140/90 mmHg. I personally advised to the patient to follow up closely with PCP for hypertension control.    All questions were answered.      Vista Lawman,  MD Gastroenterology and Hepatology [facility] Gastroenterology   This chart has been completed using [facility] Dictation software, and while attempts have been made to ensure accuracy , certain words and phrases may not be transcribed as intended

## 2023-07-01 NOTE — Patient Instructions (Signed)
It was very nice to meet you today, as dicussed with will plan for the following :  1) CT Abdomen pancreatic protocol 2) upper endoscopy and colonoscopy

## 2023-07-15 ENCOUNTER — Encounter (HOSPITAL_COMMUNITY): Payer: Self-pay | Admitting: Radiology

## 2023-07-15 ENCOUNTER — Ambulatory Visit (HOSPITAL_COMMUNITY)
Admission: RE | Admit: 2023-07-15 | Discharge: 2023-07-15 | Disposition: A | Discharge status: Home / Self Care | Payer: Medicare HMO | Source: Ambulatory Visit | Attending: Gastroenterology | Admitting: Gastroenterology

## 2023-07-15 DIAGNOSIS — K862 Cyst of pancreas: Secondary | ICD-10-CM | POA: Diagnosis not present

## 2023-07-15 DIAGNOSIS — K863 Pseudocyst of pancreas: Secondary | ICD-10-CM | POA: Diagnosis not present

## 2023-07-15 DIAGNOSIS — K5730 Diverticulosis of large intestine without perforation or abscess without bleeding: Secondary | ICD-10-CM | POA: Diagnosis not present

## 2023-07-15 LAB — POCT I-STAT CREATININE: Creatinine, Ser: 1.5 mg/dL — ABNORMAL HIGH (ref 0.61–1.24)

## 2023-07-15 MED ORDER — IOHEXOL 300 MG/ML  SOLN
80.0000 mL | Freq: Once | INTRAMUSCULAR | Status: AC | PRN
  Administered 2023-07-15: 80 mL via INTRAVENOUS

## 2023-07-18 ENCOUNTER — Telehealth (INDEPENDENT_AMBULATORY_CARE_PROVIDER_SITE_OTHER): Payer: Self-pay | Admitting: Gastroenterology

## 2023-07-18 NOTE — Telephone Encounter (Signed)
Left message for pt to return call to schedule EGD/TCS with Dr.Ahmed (any room)

## 2023-07-25 NOTE — Progress Notes (Signed)
I spoke to the patient over the phone regarding CT scan findings  There is 1.1 cm cystic lesion in pancreatic tail which is reportedly new from 2017 suggesting pseudocyst  Although concerning finding is antral pyloric region wall thickening with perigastric nodes.  At this time we need to rule out gastric malignancy with new upper GI symptoms  Patient was contacted yesterday to schedule procedure but a voicemail was left.  Patient is advised to pick up phone today to schedule upper endoscopy and colonoscopy as previously discussed -----------  John Proctor: I spoke to the patient , he is expecting phone call today. Thank you

## 2023-07-26 ENCOUNTER — Other Ambulatory Visit (INDEPENDENT_AMBULATORY_CARE_PROVIDER_SITE_OTHER): Payer: Self-pay | Admitting: Gastroenterology

## 2023-07-26 MED ORDER — PEG 3350-KCL-NA BICARB-NACL 420 G PO SOLR: 4000.0000 mL | Freq: Once | ORAL | 0 refills | Status: AC

## 2023-07-26 NOTE — Progress Notes (Signed)
Pt contacted and TCS/EGD scheduled. No PA needed per insurance. Instructions mailed to patient. Prep sent to [facility]

## 2023-07-31 ENCOUNTER — Telehealth (INDEPENDENT_AMBULATORY_CARE_PROVIDER_SITE_OTHER): Payer: Self-pay | Admitting: Gastroenterology

## 2023-07-31 NOTE — Telephone Encounter (Signed)
Pt contacted and is willing to move up to 08/08/23 at 12:30. Updated instructions will be sent via my chart.   Franky Macho, MD  Marlowe Shores, LPN Kenney Houseman, just wanted to follow up , is there anyway to put this patient for Nov 7 or 8th?       Previous Messages    ----- Message ----- From: Franky Macho, MD Sent: 07/26/2023  10:35 AM EDT To: Marlowe Shores, LPN Subject: RE: procedure                                  Thank you !.  Nov 7 or 8 wont work ? Because wanted this to be more on urgent basis . ----- Message ----- From: Marlowe Shores, LPN Sent: 29/52/8413  10:15 AM EDT To: Franky Macho, MD Subject: RE: procedure                                  I was able to reach patient and he is scheduled for 08/13/23:) ----- Message ----- From: Franky Macho, MD Sent: 07/26/2023  10:00 AM EDT To: Marlowe Shores, LPN Subject: RE: procedure                                  Thank you . I tried yesterday and he picked up, and told him to stay near his phone ----- Message ----- From: Marlowe Shores, LPN Sent: 24/40/1027   4:43 PM EDT To: Franky Macho, MD Subject: RE: procedure                                  I attempted to reach pt and had to leave message. No return call at this time. I also tried all other numbers in chart and the ones on encounter form. ----- Message ----- From: Franky Macho, MD Sent: 07/25/2023   4:33 PM EDT To: Marlowe Shores, LPN Subject: RE: procedure                                  Thank you , keep me updated ----- Message ----- From: Marlowe Shores, LPN Sent: 25/36/6440   9:53 AM EDT To: Franky Macho, MD Subject: RE: procedure                                  I have left a message for him to call back. I will try again today as soon I get a chance:) ----- Message ----- From: Franky Macho, MD Sent: 07/25/2023   9:36 AM EDT To: Marlowe Shores, LPN Subject: procedure                                      Hi Regla Fitzgibbon  When can  we schedule this patient procedure , it is more urgent ?

## 2023-08-05 ENCOUNTER — Encounter (HOSPITAL_COMMUNITY): Admission: RE | Admit: 2023-08-05 | Payer: Medicare HMO | Source: Ambulatory Visit

## 2023-08-05 ENCOUNTER — Telehealth (INDEPENDENT_AMBULATORY_CARE_PROVIDER_SITE_OTHER): Payer: Self-pay | Admitting: Gastroenterology

## 2023-08-05 NOTE — Telephone Encounter (Signed)
Hi John Proctor   I spoke to the patient and re-iterated the reason/strong recommendation  for the procedure given weight loss, abnormal CT is to ensure he does not have gastric cancer/GI malignancy  He is willing to proceed but for next Thursday 11/14 as he is working to get an Research officer, political party  . Please schedule him for that day

## 2023-08-05 NOTE — Telephone Encounter (Signed)
Pt left voicemail that he is needing to cancel his TCS for 08/08/23 due to having something come up. Pt states he will call back later to reschedule.

## 2023-08-05 NOTE — Telephone Encounter (Signed)
Novemeber 14th is full at this time. Unless someone cancels I am unable to place pt on for 08/15/23. I can try to get him on another day. The patients on 08/15/23 have already made arrangements for rides and etc. I will do my best to get him on for another day. Once we start moving patients around, it can become confusing to the patient. It is already hard enough to get patients to follow instructions for prep. I will call patient as soon as I can to get him schedule as quickly as possible.

## 2023-08-08 ENCOUNTER — Encounter (HOSPITAL_COMMUNITY): Payer: Self-pay

## 2023-08-08 SURGERY — COLONOSCOPY WITH PROPOFOL
Anesthesia: Monitor Anesthesia Care

## 2023-08-12 NOTE — Telephone Encounter (Signed)
Left message to return call 

## 2023-08-12 NOTE — Telephone Encounter (Signed)
Pt contacted. Pt states he is going to wait a little while longer. Pt states he is going through a lot at this time and this is not a priority to him at this time.

## 2023-08-12 NOTE — Telephone Encounter (Signed)
Franky Macho, MD  John Shores, LPN John Proctor will in get in touch with patient for this Tuesday . Please provide patient with intrcutions. If Tuesday does not work than Try for Friday

## 2023-08-13 ENCOUNTER — Ambulatory Visit (HOSPITAL_COMMUNITY): Admit: 2023-08-13 | Payer: Medicare HMO

## 2023-08-13 ENCOUNTER — Telehealth (INDEPENDENT_AMBULATORY_CARE_PROVIDER_SITE_OTHER): Payer: Self-pay | Admitting: Gastroenterology

## 2023-08-13 NOTE — Telephone Encounter (Signed)
Thanks Tanya for reaching out to the patient . We had scheduled him previously for the procedure , he cancelled . I spoke to him and made clear the urgency and recommendation for the endoscopic evaluation to ensure he does not have malignancy . I hope he will reach out to [place] and I will make him a clinic appointment to talk face to face as well  Mitzie: please offer a clinic appointment in 1-2 weeks with myself

## 2023-08-13 NOTE — Telephone Encounter (Signed)
Patient was called on both numbers to schedule a follow up visit - but no answer

## 2023-08-26 DIAGNOSIS — Z6820 Body mass index (BMI) 20.0-20.9, adult: Secondary | ICD-10-CM | POA: Diagnosis not present

## 2023-08-26 DIAGNOSIS — A6922 Other neurologic disorders in Lyme disease: Secondary | ICD-10-CM | POA: Diagnosis not present

## 2023-08-26 DIAGNOSIS — I10 Essential (primary) hypertension: Secondary | ICD-10-CM | POA: Diagnosis not present

## 2023-08-26 DIAGNOSIS — M2550 Pain in unspecified joint: Secondary | ICD-10-CM | POA: Diagnosis not present

## 2023-10-25 DIAGNOSIS — M2550 Pain in unspecified joint: Secondary | ICD-10-CM | POA: Diagnosis not present

## 2023-10-25 DIAGNOSIS — I10 Essential (primary) hypertension: Secondary | ICD-10-CM | POA: Diagnosis not present

## 2023-10-25 DIAGNOSIS — M6281 Muscle weakness (generalized): Secondary | ICD-10-CM | POA: Diagnosis not present

## 2023-10-25 DIAGNOSIS — G7000 Myasthenia gravis without (acute) exacerbation: Secondary | ICD-10-CM | POA: Diagnosis not present

## 2023-10-25 DIAGNOSIS — Z6820 Body mass index (BMI) 20.0-20.9, adult: Secondary | ICD-10-CM | POA: Diagnosis not present

## 2023-10-25 DIAGNOSIS — G894 Chronic pain syndrome: Secondary | ICD-10-CM | POA: Diagnosis not present

## 2023-10-25 DIAGNOSIS — A6922 Other neurologic disorders in Lyme disease: Secondary | ICD-10-CM | POA: Diagnosis not present

## 2023-12-30 ENCOUNTER — Other Ambulatory Visit: Payer: Self-pay

## 2023-12-30 ENCOUNTER — Observation Stay (HOSPITAL_COMMUNITY)
Admission: EM | Admit: 2023-12-30 | Discharge: 2023-12-31 | Disposition: A | Discharge status: Home / Self Care | Attending: Internal Medicine | Admitting: Internal Medicine

## 2023-12-30 ENCOUNTER — Emergency Department (HOSPITAL_COMMUNITY)

## 2023-12-30 DIAGNOSIS — E785 Hyperlipidemia, unspecified: Secondary | ICD-10-CM | POA: Insufficient documentation

## 2023-12-30 DIAGNOSIS — T68XXXA Hypothermia, initial encounter: Secondary | ICD-10-CM | POA: Diagnosis not present

## 2023-12-30 DIAGNOSIS — Z7982 Long term (current) use of aspirin: Secondary | ICD-10-CM | POA: Diagnosis not present

## 2023-12-30 DIAGNOSIS — R0689 Other abnormalities of breathing: Secondary | ICD-10-CM | POA: Diagnosis not present

## 2023-12-30 DIAGNOSIS — Z87891 Personal history of nicotine dependence: Secondary | ICD-10-CM | POA: Diagnosis not present

## 2023-12-30 DIAGNOSIS — R9082 White matter disease, unspecified: Secondary | ICD-10-CM | POA: Diagnosis not present

## 2023-12-30 DIAGNOSIS — I129 Hypertensive chronic kidney disease with stage 1 through stage 4 chronic kidney disease, or unspecified chronic kidney disease: Secondary | ICD-10-CM | POA: Insufficient documentation

## 2023-12-30 DIAGNOSIS — N182 Chronic kidney disease, stage 2 (mild): Secondary | ICD-10-CM | POA: Insufficient documentation

## 2023-12-30 DIAGNOSIS — G7000 Myasthenia gravis without (acute) exacerbation: Secondary | ICD-10-CM | POA: Diagnosis not present

## 2023-12-30 DIAGNOSIS — Z79899 Other long term (current) drug therapy: Secondary | ICD-10-CM | POA: Diagnosis not present

## 2023-12-30 DIAGNOSIS — R42 Dizziness and giddiness: Secondary | ICD-10-CM | POA: Diagnosis not present

## 2023-12-30 DIAGNOSIS — R112 Nausea with vomiting, unspecified: Secondary | ICD-10-CM | POA: Diagnosis not present

## 2023-12-30 DIAGNOSIS — R739 Hyperglycemia, unspecified: Secondary | ICD-10-CM | POA: Insufficient documentation

## 2023-12-30 DIAGNOSIS — T68XXXD Hypothermia, subsequent encounter: Secondary | ICD-10-CM | POA: Diagnosis not present

## 2023-12-30 DIAGNOSIS — Z1152 Encounter for screening for COVID-19: Secondary | ICD-10-CM | POA: Insufficient documentation

## 2023-12-30 DIAGNOSIS — K219 Gastro-esophageal reflux disease without esophagitis: Secondary | ICD-10-CM | POA: Insufficient documentation

## 2023-12-30 DIAGNOSIS — R531 Weakness: Secondary | ICD-10-CM | POA: Diagnosis present

## 2023-12-30 DIAGNOSIS — E872 Acidosis, unspecified: Secondary | ICD-10-CM | POA: Insufficient documentation

## 2023-12-30 DIAGNOSIS — R918 Other nonspecific abnormal finding of lung field: Secondary | ICD-10-CM | POA: Diagnosis not present

## 2023-12-30 DIAGNOSIS — Z743 Need for continuous supervision: Secondary | ICD-10-CM | POA: Diagnosis not present

## 2023-12-30 DIAGNOSIS — R110 Nausea: Secondary | ICD-10-CM | POA: Diagnosis not present

## 2023-12-30 DIAGNOSIS — R29818 Other symptoms and signs involving the nervous system: Secondary | ICD-10-CM | POA: Diagnosis not present

## 2023-12-30 DIAGNOSIS — I10 Essential (primary) hypertension: Secondary | ICD-10-CM | POA: Diagnosis not present

## 2023-12-30 LAB — URINALYSIS, W/ REFLEX TO CULTURE (INFECTION SUSPECTED)
Bilirubin Urine: NEGATIVE
Glucose, UA: 50 mg/dL — AB
Hgb urine dipstick: NEGATIVE
Ketones, ur: NEGATIVE mg/dL
Leukocytes,Ua: NEGATIVE
Nitrite: NEGATIVE
Protein, ur: NEGATIVE mg/dL
Specific Gravity, Urine: 1.03 (ref 1.005–1.030)
pH: 5 (ref 5.0–8.0)

## 2023-12-30 LAB — CBC WITH DIFFERENTIAL/PLATELET
Abs Immature Granulocytes: 0.02 10*3/uL (ref 0.00–0.07)
Basophils Absolute: 0.1 10*3/uL (ref 0.0–0.1)
Basophils Relative: 1 %
Eosinophils Absolute: 0.1 10*3/uL (ref 0.0–0.5)
Eosinophils Relative: 1 %
HCT: 39.3 % (ref 39.0–52.0)
Hemoglobin: 12.4 g/dL — ABNORMAL LOW (ref 13.0–17.0)
Immature Granulocytes: 0 %
Lymphocytes Relative: 17 %
Lymphs Abs: 1.2 10*3/uL (ref 0.7–4.0)
MCH: 27.8 pg (ref 26.0–34.0)
MCHC: 31.6 g/dL (ref 30.0–36.0)
MCV: 88.1 fL (ref 80.0–100.0)
Monocytes Absolute: 0.4 10*3/uL (ref 0.1–1.0)
Monocytes Relative: 6 %
Neutro Abs: 5.5 10*3/uL (ref 1.7–7.7)
Neutrophils Relative %: 75 %
Platelets: 174 10*3/uL (ref 150–400)
RBC: 4.46 MIL/uL (ref 4.22–5.81)
RDW: 13.1 % (ref 11.5–15.5)
WBC: 7.3 10*3/uL (ref 4.0–10.5)
nRBC: 0 % (ref 0.0–0.2)

## 2023-12-30 LAB — CBG MONITORING, ED: Glucose-Capillary: 195 mg/dL — ABNORMAL HIGH (ref 70–99)

## 2023-12-30 LAB — HEMOGLOBIN A1C
Hgb A1c MFr Bld: 5.8 % — ABNORMAL HIGH (ref 4.8–5.6)
Mean Plasma Glucose: 119.76 mg/dL

## 2023-12-30 LAB — C-REACTIVE PROTEIN: CRP: 0.5 mg/dL (ref <1.0)

## 2023-12-30 LAB — APTT: aPTT: 28 s (ref 24–36)

## 2023-12-30 LAB — COMPREHENSIVE METABOLIC PANEL WITH GFR
ALT: 14 U/L (ref 0–44)
AST: 24 U/L (ref 15–41)
Albumin: 4.1 g/dL (ref 3.5–5.0)
Alkaline Phosphatase: 60 U/L (ref 38–126)
Anion gap: 12 (ref 5–15)
BUN: 33 mg/dL — ABNORMAL HIGH (ref 8–23)
CO2: 20 mmol/L — ABNORMAL LOW (ref 22–32)
Calcium: 9.2 mg/dL (ref 8.9–10.3)
Chloride: 106 mmol/L (ref 98–111)
Creatinine, Ser: 1.27 mg/dL — ABNORMAL HIGH (ref 0.61–1.24)
GFR, Estimated: 60 mL/min — ABNORMAL LOW (ref >60)
Glucose, Bld: 216 mg/dL — ABNORMAL HIGH (ref 70–99)
Potassium: 3.8 mmol/L (ref 3.5–5.1)
Sodium: 138 mmol/L (ref 135–145)
Total Bilirubin: 0.7 mg/dL (ref 0.0–1.2)
Total Protein: 7.2 g/dL (ref 6.5–8.1)

## 2023-12-30 LAB — RESP PANEL BY RT-PCR (RSV, FLU A&B, COVID)  RVPGX2
Influenza A by PCR: NEGATIVE
Influenza B by PCR: NEGATIVE
Resp Syncytial Virus by PCR: NEGATIVE
SARS Coronavirus 2 by RT PCR: NEGATIVE

## 2023-12-30 LAB — TSH: TSH: 1.009 u[IU]/mL (ref 0.350–4.500)

## 2023-12-30 LAB — T4, FREE: Free T4: 1.06 ng/dL (ref 0.61–1.12)

## 2023-12-30 LAB — MRSA NEXT GEN BY PCR, NASAL: MRSA by PCR Next Gen: NOT DETECTED

## 2023-12-30 LAB — PROTIME-INR
INR: 1 (ref 0.8–1.2)
Prothrombin Time: 13.8 s (ref 11.4–15.2)

## 2023-12-30 LAB — TROPONIN I (HIGH SENSITIVITY)
Troponin I (High Sensitivity): 3 ng/L (ref <18)
Troponin I (High Sensitivity): 3 ng/L (ref <18)

## 2023-12-30 LAB — CORTISOL: Cortisol, Plasma: 28.7 ug/dL

## 2023-12-30 LAB — SEDIMENTATION RATE: Sed Rate: 4 mm/h (ref 0–16)

## 2023-12-30 LAB — MAGNESIUM: Magnesium: 2 mg/dL (ref 1.7–2.4)

## 2023-12-30 LAB — LACTIC ACID, PLASMA
Lactic Acid, Venous: 2.2 mmol/L (ref 0.5–1.9)
Lactic Acid, Venous: 1.3 mmol/L (ref 0.5–1.9)

## 2023-12-30 MED ORDER — ROSUVASTATIN CALCIUM 20 MG PO TABS
20.0000 mg | ORAL_TABLET | Freq: Every day | ORAL | Status: DC
  Administered 2023-12-30 – 2023-12-31 (×2): 20 mg via ORAL
  Filled 2023-12-30 (×2): qty 1

## 2023-12-30 MED ORDER — CHLORHEXIDINE GLUCONATE CLOTH 2 % EX PADS
6.0000 | MEDICATED_PAD | Freq: Every day | CUTANEOUS | Status: DC
Start: 2023-12-31 — End: 2023-12-31
  Administered 2023-12-31: 6 via TOPICAL

## 2023-12-30 MED ORDER — VANCOMYCIN HCL 1500 MG/300ML IV SOLN
1500.0000 mg | Freq: Once | INTRAVENOUS | Status: AC
  Administered 2023-12-30: 1500 mg via INTRAVENOUS
  Filled 2023-12-30: qty 300

## 2023-12-30 MED ORDER — ONDANSETRON HCL 4 MG/2ML IJ SOLN: 4.0000 mg | Freq: Four times a day (QID) | INTRAMUSCULAR | Status: DC | PRN

## 2023-12-30 MED ORDER — ENOXAPARIN SODIUM 40 MG/0.4ML IJ SOSY
40.0000 mg | PREFILLED_SYRINGE | INTRAMUSCULAR | Status: DC
  Administered 2023-12-30: 40 mg via SUBCUTANEOUS
  Filled 2023-12-30: qty 0.4

## 2023-12-30 MED ORDER — PANTOPRAZOLE SODIUM 40 MG PO TBEC
40.0000 mg | DELAYED_RELEASE_TABLET | Freq: Every day | ORAL | Status: DC
  Administered 2023-12-30 – 2023-12-31 (×2): 40 mg via ORAL
  Filled 2023-12-30 (×2): qty 1

## 2023-12-30 MED ORDER — ONDANSETRON HCL 4 MG PO TABS
4.0000 mg | ORAL_TABLET | Freq: Four times a day (QID) | ORAL | Status: DC | PRN
  Administered 2023-12-30: 4 mg via ORAL
  Filled 2023-12-30: qty 1

## 2023-12-30 MED ORDER — VANCOMYCIN HCL IN DEXTROSE 1-5 GM/200ML-% IV SOLN: 1000.0000 mg | Freq: Once | INTRAVENOUS | Status: DC

## 2023-12-30 MED ORDER — ACETAMINOPHEN 650 MG RE SUPP: 650.0000 mg | Freq: Four times a day (QID) | RECTAL | Status: DC | PRN

## 2023-12-30 MED ORDER — LACTATED RINGERS IV BOLUS (SEPSIS)
1000.0000 mL | Freq: Once | INTRAVENOUS | Status: AC
  Administered 2023-12-30: 1000 mL via INTRAVENOUS

## 2023-12-30 MED ORDER — METRONIDAZOLE 500 MG/100ML IV SOLN
500.0000 mg | Freq: Once | INTRAVENOUS | Status: AC
  Administered 2023-12-30: 500 mg via INTRAVENOUS
  Filled 2023-12-30: qty 100

## 2023-12-30 MED ORDER — ACETAMINOPHEN 325 MG PO TABS: 650.0000 mg | ORAL_TABLET | Freq: Four times a day (QID) | ORAL | Status: DC | PRN

## 2023-12-30 MED ORDER — ORAL CARE MOUTH RINSE: 15.0000 mL | OROMUCOSAL | Status: DC | PRN

## 2023-12-30 MED ORDER — SODIUM CHLORIDE 0.9 % IV SOLN: INTRAVENOUS | Status: AC

## 2023-12-30 MED ORDER — SODIUM CHLORIDE 0.9 % IV SOLN
2.0000 g | Freq: Once | INTRAVENOUS | Status: AC
  Administered 2023-12-30: 2 g via INTRAVENOUS
  Filled 2023-12-30: qty 12.5

## 2023-12-30 NOTE — Sepsis Progress Note (Signed)
 Elink monitoring for the code sepsis protocol.

## 2023-12-30 NOTE — H&P (Signed)
 History and Physical    Patient: John Proctor BJY:[id number] DOB: 03/26/1951 DOA: 12/30/2023 DOS: the patient was seen and examined on 12/30/2023 PCP: Elfredia Nevins, MD   Patient coming from: Home  Chief Complaint:  Chief Complaint  Patient presents with   Weakness   HPI: ASEEL UHDE is a 73 y.o. male with medical history significant of hypertension, hyperlipidemia, chronic kidney disease stage II and history of myasthenia gravis; who presented to the hospital secondary to generalized weakness, Dizziness and nausea.  Patient reports symptoms presented suddenly starting earlier this morning.  No vomiting, no diarrhea, no abdominal pain, no focal weaknesses, no been any coughing spells or sick contacts.  Patient also denies dysuria, hematuria, melena and hematochezia.  While in the ED workup demonstrated the presence of significant hypothermia and soft blood pressure; initial concern was for early sepsis but no signs of acute infection appreciated in his lungs, urine or any other active source.  Empirically cover with antibiotics; fluid resuscitation provided and patient started on bear hugger.  TRH was contacted to place patient in the hospital for further evaluation and management.  Review of Systems: As mentioned in the history of present illness. All other systems reviewed and are negative. Past Medical History:  Diagnosis Date   Chronic kidney disease    Hypertension    Past Surgical History:  Procedure Laterality Date   ANKLE SURGERY Right    fusion   COLONOSCOPY     EYE SURGERY Left    Social History:  reports that he quit smoking about 11 years ago. His smoking use included cigarettes. He started smoking about 36 years ago. He has a 12.5 pack-year smoking history. He has never used smokeless tobacco. He reports current alcohol use of about 7.0 standard drinks of alcohol per week. He reports that he does not use drugs.  Allergies  Allergen Reactions   Gabapentin  Nausea Only    Makes my head feel funny    Family History  Problem Relation Age of Onset   Multiple sclerosis Mother    High blood pressure Father    Heart attack Brother    High blood pressure Brother     Prior to Admission medications   Medication Sig Start Date End Date Taking? Authorizing Provider  amLODipine (NORVASC) 10 MG tablet Take 10 mg by mouth daily. 04/11/21   [provider]  aspirin EC 81 MG tablet Take 81 mg by mouth daily. Swallow whole.    [provider]  HYDROcodone-acetaminophen (NORCO) 10-325 MG tablet Take 1 tablet by mouth every 6 (six) hours as needed.    [provider]  lisinopril (ZESTRIL) 40 MG tablet Take 40 mg by mouth daily. 04/10/19   [provider]  metoprolol succinate (TOPROL-XL) 25 MG 24 hr tablet Take 25 mg by mouth daily. 04/11/21   [provider]  naproxen sodium (ALEVE) 220 MG tablet Take 220 mg by mouth 3 (three) times daily as needed (pain).    [provider]  rosuvastatin (CRESTOR) 20 MG tablet Take 20 mg by mouth daily.    [provider]    Physical Exam: Vitals:   12/30/23 1327 12/30/23 1340 12/30/23 1400 12/30/23 1431  BP: (!) 158/81  104/67   Pulse:  79 73   Resp:  20 15   Temp: (!) 97 F (36.1 C)   (!) 97.5 F (36.4 C)  TempSrc: Rectal   Rectal  SpO2:  100% 97%   Weight:  Height:       General exam: Alert, awake, oriented x 3; in no major distress. Respiratory system: Clear to auscultation. Respiratory effort normal.  Good saturation on room air. Cardiovascular system:RRR. No murmurs, rubs, gallops.  No JVD. Gastrointestinal system: Abdomen is nondistended, soft and nontender. No organomegaly or masses felt. Normal bowel sounds heard. Central nervous system: Reporting feeling generally weak .no focal neurological deficits. Extremities: No cyanosis or clubbing. Skin: No petechiae. Psychiatry: Judgement and insight appear normal. Mood & affect appropriate.    Data Reviewed: Urinalysis: Demonstrating no ketones, no nitrites, no leukocyte esterase, clear appearance and a specific gravity 1.030 Lactic acid: 2.2 Comprehensive metabolic panel: Sodium 138, potassium 3.8, chloride 106, bicarb 20, glucose 216, BUN 33, creatinine 1.27, normal LFTs and GFR 60. CBC: White blood cell 7.3, hemoglobin 12.4 and platelet count 174K  Assessment and Plan: 1-hypothermia -Concern for bulbar dysfunction from myasthenia gravis -No signs of acute infection currently appreciated -Continue holding on antibiotic -Provide supportive care, fluid resuscitation and continue Bear hugger -Will check cortisol level, CRP, ESR and thyroid function panel. -Follow clinical response and continue supportive care.  2-hyperglycemia -No prior history of diabetes -Maintain adequate hydration -Check CBGs fluctuation and A1c  3-hypertension -Blood pressure currently soft -Holding antihypertensive agents at time of admission -Provide fluid resuscitation -Healthy diet has been ordered.  4-lactic acidosis -In the setting of most likely dehydration -Will provide fluid resuscitation -Follow lactic acid level trend.    Advance Care Planning:   Code Status: Full Code   Consults: None  Family Communication: No family at bedside.  Severity of Illness: The appropriate patient status for this patient is OBSERVATION. Observation status is judged to be reasonable and necessary in order to provide the required intensity of service to ensure the patient's safety. The patient's presenting symptoms, physical exam findings, and initial radiographic and laboratory data in the context of their medical condition is felt to place them at decreased risk for further clinical deterioration. Furthermore, it is anticipated that the patient will be medically stable for discharge from the hospital within 2 midnights of admission.   Author: Vassie Loll, MD 12/30/2023 3:28 PM  For on call review  [email or web address].

## 2023-12-30 NOTE — ED Triage Notes (Addendum)
 Pt brought in my EMS c/o of weakness, dizziness and n/v. Pt has my Myasthenia Gravis. Woke up this morning at 7 am with worsening symptoms. LKW at 2130 last night.  Zofran given by EMS

## 2023-12-30 NOTE — Plan of Care (Signed)

## 2023-12-30 NOTE — ED Notes (Signed)
 NIF completed by RT. He pulled over -40.

## 2023-12-30 NOTE — ED Provider Notes (Signed)
 [facility] AT [facility] Provider Note   CSN: [id number] Arrival date & time: 12/30/23  4098     History  Chief Complaint  Patient presents with   Weakness    John Proctor is a 73 y.o. male.  HPI 73 year old male with a history of myasthenia gravis and hypertension presents with generalized weakness.  He went to bed last night feeling fine.  He woke up this morning and was feeling all over weak, dizzy, and nauseated.  He was given Zofran by EMS.  No headache, chest pain, sore throat, cough, or fever.  No focal weakness.  He feels overall lightheaded.  Was found to have a temperature of 93.5 rectal and was placed on the bair hugger.  Home Medications Prior to Admission medications   Medication Sig Start Date End Date Taking? Authorizing Provider  amLODipine (NORVASC) 10 MG tablet Take 10 mg by mouth daily. 04/11/21   [provider]  aspirin EC 81 MG tablet Take 81 mg by mouth daily. Swallow whole.    [provider]  HYDROcodone-acetaminophen (NORCO) 10-325 MG tablet Take 1 tablet by mouth every 6 (six) hours as needed.    [provider]  lisinopril (ZESTRIL) 40 MG tablet Take 40 mg by mouth daily. 04/10/19   [provider]  metoprolol succinate (TOPROL-XL) 25 MG 24 hr tablet Take 25 mg by mouth daily. 04/11/21   [provider]  naproxen sodium (ALEVE) 220 MG tablet Take 220 mg by mouth 3 (three) times daily as needed (pain).    [provider]  rosuvastatin (CRESTOR) 20 MG tablet Take 20 mg by mouth daily.    [provider]      Allergies    Gabapentin    Review of Systems   Review of Systems  Constitutional:  Negative for fever.  Eyes:  Negative for visual disturbance.  Respiratory:  Negative for cough and shortness of breath.   Cardiovascular:  Negative for chest pain.  Gastrointestinal:  Positive for nausea. Negative for abdominal pain and vomiting.  Genitourinary:   Negative for dysuria.  Neurological:  Positive for dizziness, weakness and light-headedness. Negative for headaches.    Physical Exam Updated Vital Signs BP 104/67   Pulse 73   Temp (!) 97.5 F (36.4 C) (Rectal)   Resp 15   Ht 6\' 4"  (1.93 m)   Wt 75.5 kg   SpO2 97%   BMI 20.26 kg/m  Physical Exam Vitals and nursing note reviewed.  Constitutional:      Appearance: He is well-developed.  HENT:     Head: Normocephalic and atraumatic.  Eyes:     Extraocular Movements: Extraocular movements intact.     Pupils: Pupils are equal, round, and reactive to light.  Cardiovascular:     Rate and Rhythm: Normal rate and regular rhythm.     Heart sounds: Normal heart sounds.  Pulmonary:     Effort: Pulmonary effort is normal.     Breath sounds: Normal breath sounds.  Abdominal:     Palpations: Abdomen is soft.     Tenderness: There is no abdominal tenderness.  Skin:    General: Skin is warm and dry.  Neurological:     Mental Status: He is alert.     Comments: CN 3-12 grossly intact. 5/5 strength in all 4 extremities. Grossly normal sensation. Normal finger to nose.      ED Results / Procedures / Treatments   Labs (all labs ordered are  listed, but only abnormal results are displayed) Labs Reviewed  LACTIC ACID, PLASMA - Abnormal; Notable for the following components:      Result Value   Lactic Acid, Venous 2.2 (*)    All other components within normal limits  COMPREHENSIVE METABOLIC PANEL WITH GFR - Abnormal; Notable for the following components:   CO2 20 (*)    Glucose, Bld 216 (*)    BUN 33 (*)    Creatinine, Ser 1.27 (*)    GFR, Estimated 60 (*)    All other components within normal limits  CBC WITH DIFFERENTIAL/PLATELET - Abnormal; Notable for the following components:   Hemoglobin 12.4 (*)    All other components within normal limits  URINALYSIS, W/ REFLEX TO CULTURE (INFECTION SUSPECTED) - Abnormal; Notable for the following components:   Glucose, UA 50 (*)     Bacteria, UA RARE (*)    All other components within normal limits  CBG MONITORING, ED - Abnormal; Notable for the following components:   Glucose-Capillary 195 (*)    All other components within normal limits  RESP PANEL BY RT-PCR (RSV, FLU A&B, COVID)  RVPGX2  CULTURE, BLOOD (ROUTINE X 2)  CULTURE, BLOOD (ROUTINE X 2)  MRSA NEXT GEN BY PCR, NASAL  LACTIC ACID, PLASMA  PROTIME-INR  APTT  MAGNESIUM  TSH  T4, FREE  SEDIMENTATION RATE  CORTISOL  HEMOGLOBIN A1C  C-REACTIVE PROTEIN  TROPONIN I (HIGH SENSITIVITY)  TROPONIN I (HIGH SENSITIVITY)    EKG EKG Interpretation Date/Time:  Monday December 30 2023 09:11:54 EDT Ventricular Rate:  72 PR Interval:  217 QRS Duration:  101 QT Interval:  418 QTC Calculation: 458 R Axis:   -14  Text Interpretation: Sinus rhythm Multiple ventricular premature complexes Borderline prolonged PR interval No old tracing to compare Confirmed by Pricilla Loveless [phone]) on 12/30/2023 9:37:12 AM  Radiology DG Chest Port 1 View Result Date: 12/30/2023 CLINICAL DATA:  History of myasthenia gravis with weakness, dizziness, nausea, and vomiting EXAM: PORTABLE CHEST 1 VIEW COMPARISON:  Chest radiograph dated 05/17/2022 FINDINGS: Normal lung volumes. Left basilar linear opacities. No pleural effusion or pneumothorax. The heart size and mediastinal contours are within normal limits. No acute osseous abnormality. IMPRESSION: Left basilar linear opacities, likely atelectasis. Electronically Signed   By: Agustin Cree M.D.   On: 12/30/2023 11:03   CT Head Wo Contrast Result Date: 12/30/2023 CLINICAL DATA:  Neuro deficit with acute stroke suspected EXAM: CT HEAD WITHOUT CONTRAST TECHNIQUE: Contiguous axial images were obtained from the base of the skull through the vertex without intravenous contrast. RADIATION DOSE REDUCTION: This exam was performed according to the departmental dose-optimization program which includes automated exposure control, adjustment of the mA and/or  kV according to patient size and/or use of iterative reconstruction technique. COMPARISON:  Brain MRI 09/03/2019 FINDINGS: Brain: No evidence of acute infarction, hemorrhage, hydrocephalus, extra-axial collection or mass lesion/mass effect. Cerebellar atrophy especially relative to the supratentorial brain, pattern stable from comparison and also affecting the brainstem. Mild chronic white matter disease Vascular: No hyperdense vessel or unexpected calcification. Skull: Normal. Negative for fracture or focal lesion. Sinuses/Orbits: No acute finding. IMPRESSION: No acute finding. Infratemporal atrophy, also seen on brain MRI in 2020 Electronically Signed   By: Tiburcio Pea M.D.   On: 12/30/2023 10:08    Procedures .Critical Care  Performed by: Pricilla Loveless, MD Authorized by: Pricilla Loveless, MD   Critical care provider statement:    Critical care time (minutes):  30   Critical care time was  exclusive of:  Separately billable procedures and treating other patients   Critical care was necessary to treat or prevent imminent or life-threatening deterioration of the following conditions:  Sepsis   Critical care was time spent personally by me on the following activities:  Development of treatment plan with patient or surrogate, discussions with consultants, evaluation of patient's response to treatment, examination of patient, ordering and review of laboratory studies, ordering and review of radiographic studies, ordering and performing treatments and interventions, pulse oximetry, re-evaluation of patient's condition and review of old charts     Medications Ordered in ED Medications  rosuvastatin (CRESTOR) tablet 20 mg (20 mg Oral Given 12/30/23 1339)  pantoprazole (PROTONIX) EC tablet 40 mg (40 mg Oral Given 12/30/23 1244)  enoxaparin (LOVENOX) injection 40 mg (has no administration in time range)  0.9 %  sodium chloride infusion ( Intravenous New Bag/Given 12/30/23 1318)  acetaminophen  (TYLENOL) tablet 650 mg (has no administration in time range)    Or  acetaminophen (TYLENOL) suppository 650 mg (has no administration in time range)  ondansetron (ZOFRAN) tablet 4 mg (4 mg Oral Given 12/30/23 1433)    Or  ondansetron (ZOFRAN) injection 4 mg ( Intravenous See Alternative 12/30/23 1433)  Chlorhexidine Gluconate Cloth 2 % PADS 6 each (has no administration in time range)  lactated ringers bolus 1,000 mL (0 mLs Intravenous Stopped 12/30/23 1156)  ceFEPIme (MAXIPIME) 2 g in sodium chloride 0.9 % 100 mL IVPB (0 g Intravenous Stopped 12/30/23 1058)  metroNIDAZOLE (FLAGYL) IVPB 500 mg (0 mg Intravenous Stopped 12/30/23 1145)  vancomycin (VANCOREADY) IVPB 1500 mg/300 mL (0 mg Intravenous Stopped 12/30/23 1256)    ED Course/ Medical Decision Making/ A&P                                 Medical Decision Making Amount and/or Complexity of Data Reviewed Labs: ordered.    Details: Elevated lactate that cleared with fluids. Radiology: ordered.    Details: No pneumonia ECG/medicine tests: ordered and independent interpretation performed.    Details: No ischemia  Risk Prescription drug management. Decision regarding hospitalization.   Patient presents with generalized weakness.  Has myasthenia gravis but does not appear to have any obvious focal weakness or respiratory concerns at this time.  He is quite hypothermic at 93.5 Fahrenheit.  Warming blanket was applied and he was treated for possible sepsis with broad IV antibiotics and some fluids.  No hypotension though he did have a couple soft blood pressures.  He will need to have continued workup and supportive care in the hospital.  Discussed with Dr. Gwenlyn Perking for admission.        Final Clinical Impression(s) / ED Diagnoses Final diagnoses:  Hypothermia, initial encounter    Rx / DC Orders ED Discharge Orders     None         Pricilla Loveless, MD 12/30/23 [phone]

## 2023-12-30 NOTE — ED Notes (Signed)
 Awaiting transportation to ICU at this time.

## 2023-12-31 DIAGNOSIS — T68XXXD Hypothermia, subsequent encounter: Secondary | ICD-10-CM | POA: Diagnosis not present

## 2023-12-31 DIAGNOSIS — T68XXXA Hypothermia, initial encounter: Secondary | ICD-10-CM | POA: Diagnosis not present

## 2023-12-31 LAB — BASIC METABOLIC PANEL WITH GFR
Anion gap: 5 (ref 5–15)
BUN: 22 mg/dL (ref 8–23)
CO2: 24 mmol/L (ref 22–32)
Calcium: 8.8 mg/dL — ABNORMAL LOW (ref 8.9–10.3)
Chloride: 109 mmol/L (ref 98–111)
Creatinine, Ser: 1.08 mg/dL (ref 0.61–1.24)
GFR, Estimated: >60 mL/min (ref >60)
Glucose, Bld: 88 mg/dL (ref 70–99)
Potassium: 4.2 mmol/L (ref 3.5–5.1)
Sodium: 138 mmol/L (ref 135–145)

## 2023-12-31 MED ORDER — PANTOPRAZOLE SODIUM 40 MG PO TBEC: 40.0000 mg | DELAYED_RELEASE_TABLET | Freq: Every day | ORAL | 1 refills | Status: AC

## 2023-12-31 NOTE — TOC CM/SW Note (Signed)
 Transition of Care [facility]) - Inpatient Brief Assessment   Patient Details  Name: John Proctor MRN: [id number] Date of Birth: June 11, 1951  Transition of Care [facility]) CM/SW Contact:    Villa Herb, LCSWA Phone Number: 12/31/2023, 9:45 AM   Clinical Narrative: Transition of Care Department [facility]) has reviewed patient and no TOC needs have been identified at this time. We will continue to monitor patient advancement through interdiciplinary progression rounds. If new patient transition needs arise, please place a TOC consult.   Transition of Care Asessment: Insurance and Status: Insurance coverage has been reviewed Patient has primary care physician: Yes Home environment has been reviewed: From home Prior level of function:: Independent Prior/Current Home Services: No current home services Social Drivers of Health Review: SDOH reviewed no interventions necessary Readmission risk has been reviewed: Yes Transition of care needs: no transition of care needs at this time

## 2023-12-31 NOTE — Care Management Obs Status (Signed)
 MEDICARE OBSERVATION STATUS NOTIFICATION   Patient Details  Name: John Proctor MRN: [id number] Date of Birth: 07-Jan-1951   Medicare Observation Status Notification Given:  Yes    Corey Harold 12/31/2023, 12:15 PM

## 2023-12-31 NOTE — Discharge Summary (Signed)
 Physician Discharge Summary   Patient: John Proctor MRN: [id number] DOB: 1950-12-25  Admit date:     12/30/2023  Discharge date: 12/31/23  Discharge Physician: Vassie Loll   PCP: Elfredia Nevins, MD   Recommendations at discharge:  Repeat basic metabolic panel to follow electrolytes and renal function Reassess blood pressure and further adjust antihypertensive agents as needed. Continue following progression and need to initiate treatment for underlying myasthenia gravis condition. Continue to follow patient's CBG fluctuation as it was found to be prediabetic.  Discharge Diagnoses: Principal Problem:   Hypothermia Hypertension Hyperlipidemia GERD Myasthenia gravis Lactic acidosis Hyperglycemia  Brief Hospital admission narrative: John Proctor is a 73 y.o. male with medical history significant of hypertension, hyperlipidemia, chronic kidney disease stage II and history of myasthenia gravis; who presented to the hospital secondary to generalized weakness, Dizziness and nausea.  Patient reports symptoms presented suddenly starting earlier this morning.  No vomiting, no diarrhea, no abdominal pain, no focal weaknesses, no been any coughing spells or sick contacts.  Patient also denies dysuria, hematuria, melena and hematochezia.   While in the ED workup demonstrated the presence of significant hypothermia and soft blood pressure; initial concern was for early sepsis but no signs of acute infection appreciated in his lungs, urine or any other active source.  Empirically cover with antibiotics; fluid resuscitation provided and patient started on bear hugger.  TR[facility] was contacted to place patient in the hospital for further evaluation and management.  Assessment and Plan: 1-hypothermia -Concern for bulbar dysfunction from myasthenia gravis; will recommend continued patient follow-up with PCP to further determine the need of initiating treatment with pyridostigmine/steroids. -No  signs of acute infection currently appreciated -No antibiotic therapy provided as there was no source of infection identified. -Temperature improving within normal limits at discharge. -Stable and improved; cortisol level CRP and ESR within normal limits. -Thyroid panel also within normal limits.   2-hyperglycemia/prediabetes -No prior history of diabetes -Patient advised to maintain adequate hydration and to follow modified carbohydrate diet -A1c of 5.8 demonstrating prediabetes  3-hypertension -Blood pressure stable at discharge -Amlodipine has been discontinue -Continue home dose of metoprolol -Heart healthy/low-sodium diet discussed with patient. -Reassess blood pressure and further adjust medication as needed.   4-lactic acidosis -Resolved after fluid resuscitation -Appears to be associated with dehydration -Patient advised to maintain adequate hydration.  5-hyperlipidemia -Continue treatment with a statin  Consultants: None Procedures performed: See below for x-ray reports Disposition: Home Diet recommendation: Heart healthy/modified carbohydrate diet  DISCHARGE MEDICATION: Allergies as of 12/31/2023       Reactions   Gabapentin Nausea Only   Makes my head feel funny        Medication List     STOP taking these medications    amLODipine 10 MG tablet Commonly known as: NORVASC       TAKE these medications    aspirin EC 81 MG tablet Take 81 mg by mouth daily. Swallow whole.   HYDROcodone-acetaminophen 10-325 MG tablet Commonly known as: NORCO Take 1 tablet by mouth every 6 (six) hours as needed for moderate pain (pain score 4-6).   metoprolol succinate 25 MG 24 hr tablet Commonly known as: TOPROL-XL Take 25 mg by mouth daily.   pantoprazole 40 MG tablet Commonly known as: PROTONIX Take 1 tablet (40 mg total) by mouth daily. Start taking on: January 01, 2024   rosuvastatin 20 MG tablet Commonly known as: CRESTOR Take 20 mg by mouth daily.  Follow-up Information     Elfredia Nevins, MD. Schedule an appointment as soon as possible for a visit in 10 day(s).   Specialty: Internal Medicine Contact information: [address] [place] [address] [phone]                Discharge Exam: John Proctor Weights   12/30/23 0918 12/30/23 1321  Weight: 74.8 kg 75.5 kg   General exam: Alert, awake, oriented x 3 Respiratory system: Clear to auscultation. Respiratory effort normal. Cardiovascular system:RRR. No murmurs, rubs, gallops. Gastrointestinal system: Abdomen is nondistended, soft and nontender. No organomegaly or masses felt. Normal bowel sounds heard. Central nervous system: Alert and oriented. No focal neurological deficits. Extremities: No C/C/E, +pedal pulses Skin: No rashes, lesions or ulcers Psychiatry: Judgement and insight appear normal. Mood & affect appropriate.    Condition at discharge: stable  The results of significant diagnostics from this hospitalization (including imaging, microbiology, ancillary and laboratory) are listed below for reference.   Imaging Studies: DG Chest Port 1 View Result Date: 12/30/2023 CLINICAL DATA:  History of myasthenia gravis with weakness, dizziness, nausea, and vomiting EXAM: PORTABLE CHEST 1 VIEW COMPARISON:  Chest radiograph dated 05/17/2022 FINDINGS: Normal lung volumes. Left basilar linear opacities. No pleural effusion or pneumothorax. The heart size and mediastinal contours are within normal limits. No acute osseous abnormality. IMPRESSION: Left basilar linear opacities, likely atelectasis. Electronically Signed   By: Agustin Cree M.D.   On: 12/30/2023 11:03   CT Head Wo Contrast Result Date: 12/30/2023 CLINICAL DATA:  Neuro deficit with acute stroke suspected EXAM: CT HEAD WITHOUT CONTRAST TECHNIQUE: Contiguous axial images were obtained from the base of the skull through the vertex without intravenous contrast. RADIATION DOSE REDUCTION: This exam was performed  according to the departmental dose-optimization program which includes automated exposure control, adjustment of the mA and/or kV according to patient size and/or use of iterative reconstruction technique. COMPARISON:  Brain MRI 09/03/2019 FINDINGS: Brain: No evidence of acute infarction, hemorrhage, hydrocephalus, extra-axial collection or mass lesion/mass effect. Cerebellar atrophy especially relative to the supratentorial brain, pattern stable from comparison and also affecting the brainstem. Mild chronic white matter disease Vascular: No hyperdense vessel or unexpected calcification. Skull: Normal. Negative for fracture or focal lesion. Sinuses/Orbits: No acute finding. IMPRESSION: No acute finding. Infratemporal atrophy, also seen on brain MRI in 2020 Electronically Signed   By: Tiburcio Pea M.D.   On: 12/30/2023 10:08    Microbiology: Results for orders placed or performed during the hospital encounter of 12/30/23  Blood Culture (routine x 2)     Status: None (Preliminary result)   Collection Time: 12/30/23  9:43 AM   Specimen: BLOOD  Result Value Ref Range Status   Specimen Description BLOOD BLOOD LEFT HAND  Final   Special Requests   Final    BOTTLES DRAWN AEROBIC AND ANAEROBIC Blood Culture adequate volume   Culture   Final    NO GROWTH < 24 HOURS Performed at [facility], [address] [place], [place] [address]    Report Status PENDING  Incomplete  Blood Culture (routine x 2)     Status: None (Preliminary result)   Collection Time: 12/30/23  9:50 AM   Specimen: BLOOD  Result Value Ref Range Status   Specimen Description BLOOD BLOOD RIGHT ARM  Final   Special Requests   Final    BOTTLES DRAWN AEROBIC AND ANAEROBIC Blood Culture adequate volume   Culture   Final    NO GROWTH < 24 HOURS Performed at  [facility], [address] [place], [place] [address]    Report Status PENDING  Incomplete  Resp panel by RT-PCR (RSV, Flu A&B, Covid) Anterior Nasal Swab     Status: None    Collection Time: 12/30/23 11:23 AM   Specimen: Anterior Nasal Swab  Result Value Ref Range Status   SARS Coronavirus 2 by RT PCR NEGATIVE NEGATIVE Final    Comment: (NOTE) SARS-CoV-2 target nucleic acids are NOT DETECTED.  The SARS-CoV-2 RNA is generally detectable in upper respiratory specimens during the acute phase of infection. The lowest concentration of SARS-CoV-2 viral copies this assay can detect is 138 copies/mL. A negative result does not preclude SARS-Cov-2 infection and should not be used as the sole basis for treatment or other patient management decisions. A negative result may occur with  improper specimen collection/handling, submission of specimen other than nasopharyngeal swab, presence of viral mutation(s) within the areas targeted by this assay, and inadequate number of viral copies(<138 copies/mL). A negative result must be combined with clinical observations, patient history, and epidemiological information. The expected result is Negative.  Fact Sheet for Patients:  BloggerCourse.com  Fact Sheet for Healthcare Providers:  SeriousBroker.it  This test is no t yet approved or cleared by the [place] FDA and  has been authorized for detection and/or diagnosis of SARS-CoV-2 by FDA under an Emergency Use Authorization (EUA). This EUA will remain  in effect (meaning this test can be used) for the duration of the COVID-19 declaration under Section 564(b)(1) of the Act, 21 U.S.C.section 360bbb-3(b)(1), unless the authorization is terminated  or revoked sooner.       Influenza A by PCR NEGATIVE NEGATIVE Final   Influenza B by PCR NEGATIVE NEGATIVE Final    Comment: (NOTE) The Xpert Xpress SARS-CoV-2/FLU/RSV plus assay is intended as an aid in the diagnosis of influenza from Nasopharyngeal swab specimens and should not be used as a sole basis for treatment. Nasal washings and aspirates are unacceptable for  Xpert Xpress SARS-CoV-2/FLU/RSV testing.  Fact Sheet for Patients: BloggerCourse.com  Fact Sheet for Healthcare Providers: SeriousBroker.it  This test is not yet approved or cleared by the [place] FDA and has been authorized for detection and/or diagnosis of SARS-CoV-2 by FDA under an Emergency Use Authorization (EUA). This EUA will remain in effect (meaning this test can be used) for the duration of the COVID-19 declaration under Section 564(b)(1) of the Act, 21 U.S.C. section 360bbb-3(b)(1), unless the authorization is terminated or revoked.     Resp Syncytial Virus by PCR NEGATIVE NEGATIVE Final    Comment: (NOTE) Fact Sheet for Patients: BloggerCourse.com  Fact Sheet for Healthcare Providers: SeriousBroker.it  This test is not yet approved or cleared by the [place] FDA and has been authorized for detection and/or diagnosis of SARS-CoV-2 by FDA under an Emergency Use Authorization (EUA). This EUA will remain in effect (meaning this test can be used) for the duration of the COVID-19 declaration under Section 564(b)(1) of the Act, 21 U.S.C. section 360bbb-3(b)(1), unless the authorization is terminated or revoked.  Performed at [facility], [address] [place], [place] [address]   MRSA Next Gen by PCR, Nasal     Status: None   Collection Time: 12/30/23  1:01 PM   Specimen: Nasal Mucosa; Nasal Swab  Result Value Ref Range Status   MRSA by PCR Next Gen NOT DETECTED NOT DETECTED Final    Comment: (NOTE) The GeneXpert MRSA Assay (FDA approved for NASAL specimens only), is one component of  a comprehensive MRSA colonization surveillance program. It is not intended to diagnose MRSA infection nor to guide or monitor treatment for MRSA infections. Test performance is not FDA approved in patients less than 63 years old. Performed at [facility], [address]  [place], [place] [address]     Labs: CBC: Recent Labs  Lab 12/30/23 0943  WBC 7.3  NEUTROABS 5.5  HGB 12.4*  HCT 39.3  MCV 88.1  PLT 174   Basic Metabolic Panel: Recent Labs  Lab 12/30/23 0943 12/31/23 0506  NA 138 138  K 3.8 4.2  CL 106 109  CO2 20* 24  GLUCOSE 216* 88  BUN 33* 22  CREATININE 1.27* 1.08  CALCIUM 9.2 8.8*  MG 2.0  --    Liver Function Tests: Recent Labs  Lab 12/30/23 0943  AST 24  ALT 14  ALKPHOS 60  BILITOT 0.7  PROT 7.2  ALBUMIN 4.1   CBG: Recent Labs  Lab 12/30/23 0916  GLUCAP 195*    Discharge time spent: greater than 30 minutes.  Signed: Vassie Loll, MD [facility] 12/31/2023

## 2024-01-04 LAB — CULTURE, BLOOD (ROUTINE X 2)
Culture: NO GROWTH
Culture: NO GROWTH
Special Requests: ADEQUATE
Special Requests: ADEQUATE

## 2024-01-27 DIAGNOSIS — M6281 Muscle weakness (generalized): Secondary | ICD-10-CM | POA: Diagnosis not present

## 2024-01-27 DIAGNOSIS — A6922 Other neurologic disorders in Lyme disease: Secondary | ICD-10-CM | POA: Diagnosis not present

## 2024-01-27 DIAGNOSIS — Z6820 Body mass index (BMI) 20.0-20.9, adult: Secondary | ICD-10-CM | POA: Diagnosis not present

## 2024-01-27 DIAGNOSIS — I10 Essential (primary) hypertension: Secondary | ICD-10-CM | POA: Diagnosis not present

## 2024-01-27 DIAGNOSIS — Z9229 Personal history of other drug therapy: Secondary | ICD-10-CM | POA: Diagnosis not present

## 2024-01-27 DIAGNOSIS — Z0001 Encounter for general adult medical examination with abnormal findings: Secondary | ICD-10-CM | POA: Diagnosis not present

## 2024-01-27 DIAGNOSIS — G7000 Myasthenia gravis without (acute) exacerbation: Secondary | ICD-10-CM | POA: Diagnosis not present

## 2024-01-27 DIAGNOSIS — Z1331 Encounter for screening for depression: Secondary | ICD-10-CM | POA: Diagnosis not present

## 2024-01-27 DIAGNOSIS — R7309 Other abnormal glucose: Secondary | ICD-10-CM | POA: Diagnosis not present

## 2024-03-25 DIAGNOSIS — Z681 Body mass index (BMI) 19 or less, adult: Secondary | ICD-10-CM | POA: Diagnosis not present

## 2024-03-25 DIAGNOSIS — G7000 Myasthenia gravis without (acute) exacerbation: Secondary | ICD-10-CM | POA: Diagnosis not present

## 2024-03-25 DIAGNOSIS — M2550 Pain in unspecified joint: Secondary | ICD-10-CM | POA: Diagnosis not present

## 2024-03-25 DIAGNOSIS — I10 Essential (primary) hypertension: Secondary | ICD-10-CM | POA: Diagnosis not present

## 2024-03-25 DIAGNOSIS — G894 Chronic pain syndrome: Secondary | ICD-10-CM | POA: Diagnosis not present

## 2024-03-25 DIAGNOSIS — Z9229 Personal history of other drug therapy: Secondary | ICD-10-CM | POA: Diagnosis not present

## 2024-03-25 DIAGNOSIS — A6922 Other neurologic disorders in Lyme disease: Secondary | ICD-10-CM | POA: Diagnosis not present

## 2024-04-15 DIAGNOSIS — A084 Viral intestinal infection, unspecified: Secondary | ICD-10-CM | POA: Diagnosis not present

## 2024-04-15 DIAGNOSIS — Z681 Body mass index (BMI) 19 or less, adult: Secondary | ICD-10-CM | POA: Diagnosis not present

## 2024-04-15 DIAGNOSIS — R112 Nausea with vomiting, unspecified: Secondary | ICD-10-CM | POA: Diagnosis not present

## 2024-04-15 DIAGNOSIS — R030 Elevated blood-pressure reading, without diagnosis of hypertension: Secondary | ICD-10-CM | POA: Diagnosis not present

## 2024-05-22 ENCOUNTER — Encounter: Payer: Self-pay | Admitting: Radiology

## 2024-06-08 DIAGNOSIS — G4700 Insomnia, unspecified: Secondary | ICD-10-CM | POA: Diagnosis not present

## 2024-06-08 DIAGNOSIS — I10 Essential (primary) hypertension: Secondary | ICD-10-CM | POA: Diagnosis not present

## 2024-06-08 DIAGNOSIS — E782 Mixed hyperlipidemia: Secondary | ICD-10-CM | POA: Diagnosis not present

## 2024-06-08 DIAGNOSIS — Z7689 Persons encountering health services in other specified circumstances: Secondary | ICD-10-CM | POA: Diagnosis not present

## 2024-06-08 DIAGNOSIS — G894 Chronic pain syndrome: Secondary | ICD-10-CM | POA: Diagnosis not present

## 2024-06-08 DIAGNOSIS — N182 Chronic kidney disease, stage 2 (mild): Secondary | ICD-10-CM | POA: Diagnosis not present

## 2024-06-08 DIAGNOSIS — I129 Hypertensive chronic kidney disease with stage 1 through stage 4 chronic kidney disease, or unspecified chronic kidney disease: Secondary | ICD-10-CM | POA: Diagnosis not present

## 2024-06-08 DIAGNOSIS — R7303 Prediabetes: Secondary | ICD-10-CM | POA: Diagnosis not present

## 2024-06-08 DIAGNOSIS — Z9181 History of falling: Secondary | ICD-10-CM | POA: Diagnosis not present

## 2024-06-08 DIAGNOSIS — G7000 Myasthenia gravis without (acute) exacerbation: Secondary | ICD-10-CM | POA: Diagnosis not present

## 2024-06-08 DIAGNOSIS — Z9889 Other specified postprocedural states: Secondary | ICD-10-CM | POA: Diagnosis not present

## 2024-07-13 DIAGNOSIS — I700 Atherosclerosis of aorta: Secondary | ICD-10-CM | POA: Diagnosis not present

## 2024-07-13 DIAGNOSIS — Z9181 History of falling: Secondary | ICD-10-CM | POA: Diagnosis not present

## 2024-07-13 DIAGNOSIS — I10 Essential (primary) hypertension: Secondary | ICD-10-CM | POA: Diagnosis not present

## 2024-07-13 DIAGNOSIS — G894 Chronic pain syndrome: Secondary | ICD-10-CM | POA: Diagnosis not present

## 2024-07-13 DIAGNOSIS — E782 Mixed hyperlipidemia: Secondary | ICD-10-CM | POA: Diagnosis not present

## 2024-07-13 DIAGNOSIS — G7000 Myasthenia gravis without (acute) exacerbation: Secondary | ICD-10-CM | POA: Diagnosis not present

## 2024-07-13 DIAGNOSIS — N182 Chronic kidney disease, stage 2 (mild): Secondary | ICD-10-CM | POA: Diagnosis not present

## 2024-07-13 DIAGNOSIS — I2510 Atherosclerotic heart disease of native coronary artery without angina pectoris: Secondary | ICD-10-CM | POA: Diagnosis not present

## 2024-07-13 DIAGNOSIS — I129 Hypertensive chronic kidney disease with stage 1 through stage 4 chronic kidney disease, or unspecified chronic kidney disease: Secondary | ICD-10-CM | POA: Diagnosis not present

## 2024-07-13 DIAGNOSIS — K8689 Other specified diseases of pancreas: Secondary | ICD-10-CM | POA: Diagnosis not present

## 2024-07-13 DIAGNOSIS — R7303 Prediabetes: Secondary | ICD-10-CM | POA: Diagnosis not present

## 2024-07-13 DIAGNOSIS — R011 Cardiac murmur, unspecified: Secondary | ICD-10-CM | POA: Diagnosis not present

## 2024-07-16 ENCOUNTER — Encounter (INDEPENDENT_AMBULATORY_CARE_PROVIDER_SITE_OTHER): Payer: Self-pay | Admitting: *Deleted

## 2024-07-27 ENCOUNTER — Encounter (INDEPENDENT_AMBULATORY_CARE_PROVIDER_SITE_OTHER): Admitting: Gastroenterology

## 2024-08-03 ENCOUNTER — Encounter: Payer: Self-pay | Admitting: Radiology

## 2024-10-13 ENCOUNTER — Encounter: Payer: Self-pay | Admitting: *Deleted

## 2024-10-13 NOTE — Progress Notes (Signed)
 John Proctor                                          MRN: [id number]   10/13/2024   The [facility] Quality Team Specialist reviewed this patient medical record for the purposes of chart review for care gap closure. The following were reviewed: chart review for care gap closure-controlling blood pressure.    [facility] Quality Team
